# Patient Record
Sex: Female | Born: 1965
Health system: Southern US, Community
[De-identification: ages and names within clinical notes are randomized; demographics above are authoritative.]

## PROBLEM LIST (undated history)

## (undated) DIAGNOSIS — M797 Fibromyalgia: Secondary | ICD-10-CM

## (undated) DIAGNOSIS — B1920 Unspecified viral hepatitis C without hepatic coma: Secondary | ICD-10-CM

## (undated) DIAGNOSIS — F32A Depression, unspecified: Secondary | ICD-10-CM

## (undated) DIAGNOSIS — F4310 Post-traumatic stress disorder, unspecified: Secondary | ICD-10-CM

## (undated) DIAGNOSIS — R413 Other amnesia: Secondary | ICD-10-CM

## (undated) DIAGNOSIS — F319 Bipolar disorder, unspecified: Secondary | ICD-10-CM

## (undated) DIAGNOSIS — F329 Major depressive disorder, single episode, unspecified: Secondary | ICD-10-CM

## (undated) HISTORY — DX: Other amnesia: R41.3

## (undated) HISTORY — DX: Unspecified viral hepatitis C without hepatic coma: B19.20

## (undated) HISTORY — DX: Bipolar disorder, unspecified: F31.9

## (undated) HISTORY — DX: Fibromyalgia: M79.7

## (undated) HISTORY — DX: Post-traumatic stress disorder, unspecified: F43.10

## (undated) HISTORY — DX: Major depressive disorder, single episode, unspecified: F32.9

## (undated) HISTORY — PX: ABDOMINAL HYSTERECTOMY: SHX81

## (undated) HISTORY — PX: OTHER SURGICAL HISTORY: SHX169

## (undated) HISTORY — PX: APPENDECTOMY: SHX54

## (undated) HISTORY — DX: Depression, unspecified: F32.A

---

## 2012-10-03 ENCOUNTER — Encounter (INDEPENDENT_AMBULATORY_CARE_PROVIDER_SITE_OTHER): Payer: Self-pay | Admitting: *Deleted

## 2012-10-10 ENCOUNTER — Ambulatory Visit (INDEPENDENT_AMBULATORY_CARE_PROVIDER_SITE_OTHER): Payer: Self-pay | Admitting: Internal Medicine

## 2012-10-16 ENCOUNTER — Ambulatory Visit (INDEPENDENT_AMBULATORY_CARE_PROVIDER_SITE_OTHER): Payer: Self-pay | Admitting: Internal Medicine

## 2012-10-30 ENCOUNTER — Ambulatory Visit (INDEPENDENT_AMBULATORY_CARE_PROVIDER_SITE_OTHER): Payer: Medicare FFS | Admitting: Internal Medicine

## 2012-10-30 ENCOUNTER — Encounter (INDEPENDENT_AMBULATORY_CARE_PROVIDER_SITE_OTHER): Payer: Self-pay | Admitting: Internal Medicine

## 2012-10-30 VITALS — BP 96/58 | HR 72 | Temp 97.7°F | Ht 66.0 in | Wt 133.7 lb

## 2012-10-30 DIAGNOSIS — B1920 Unspecified viral hepatitis C without hepatic coma: Secondary | ICD-10-CM | POA: Insufficient documentation

## 2012-10-30 DIAGNOSIS — F319 Bipolar disorder, unspecified: Secondary | ICD-10-CM

## 2012-10-30 DIAGNOSIS — F32A Depression, unspecified: Secondary | ICD-10-CM | POA: Insufficient documentation

## 2012-10-30 DIAGNOSIS — IMO0001 Reserved for inherently not codable concepts without codable children: Secondary | ICD-10-CM

## 2012-10-30 DIAGNOSIS — F329 Major depressive disorder, single episode, unspecified: Secondary | ICD-10-CM

## 2012-10-30 DIAGNOSIS — A6000 Herpesviral infection of urogenital system, unspecified: Secondary | ICD-10-CM

## 2012-10-30 DIAGNOSIS — F4310 Post-traumatic stress disorder, unspecified: Secondary | ICD-10-CM

## 2012-10-30 DIAGNOSIS — M797 Fibromyalgia: Secondary | ICD-10-CM

## 2012-10-30 NOTE — Patient Instructions (Addendum)
Labs for Hep C.  OV in 1 month.
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
## 2012-10-31 LAB — COMPREHENSIVE METABOLIC PANEL
ALT: 34 U/L (ref 0–35)
AST: 38 U/L — ABNORMAL HIGH (ref 0–37)
Albumin: 3.6 g/dL (ref 3.5–5.2)
Alkaline Phosphatase: 81 U/L (ref 39–117)
Potassium: 3.9 mEq/L (ref 3.5–5.3)
Sodium: 140 mEq/L (ref 135–145)
Total Protein: 6.2 g/dL (ref 6.0–8.3)

## 2012-10-31 LAB — CBC WITH DIFFERENTIAL/PLATELET
Basophils Absolute: 0 10*3/uL (ref 0.0–0.1)
Basophils Relative: 0 % (ref 0–1)
Hemoglobin: 11.2 g/dL — ABNORMAL LOW (ref 12.0–15.0)
MCHC: 33.4 g/dL (ref 30.0–36.0)
Monocytes Relative: 8 % (ref 3–12)
Neutro Abs: 3.9 10*3/uL (ref 1.7–7.7)
Neutrophils Relative %: 55 % (ref 43–77)
RDW: 12.8 % (ref 11.5–15.5)

## 2012-10-31 LAB — PROTIME-INR
INR: 0.95 (ref <1.50)
Prothrombin Time: 12.7 seconds (ref 11.6–15.2)

## 2012-10-31 LAB — AFP TUMOR MARKER: AFP-Tumor Marker: 1.6 ng/mL (ref 0.0–8.0)

## 2012-11-01 ENCOUNTER — Ambulatory Visit (HOSPITAL_COMMUNITY): Payer: Medicare FFS

## 2012-11-05 ENCOUNTER — Encounter (INDEPENDENT_AMBULATORY_CARE_PROVIDER_SITE_OTHER): Payer: Self-pay

## 2012-11-08 ENCOUNTER — Ambulatory Visit (HOSPITAL_COMMUNITY)
Admission: RE | Admit: 2012-11-08 | Discharge: 2012-11-08 | Disposition: A | Discharge status: Home / Self Care | Payer: Medicare FFS | Source: Ambulatory Visit | Attending: Internal Medicine | Admitting: Internal Medicine

## 2012-11-08 DIAGNOSIS — B1920 Unspecified viral hepatitis C without hepatic coma: Secondary | ICD-10-CM

## 2012-11-15 ENCOUNTER — Telehealth (INDEPENDENT_AMBULATORY_CARE_PROVIDER_SITE_OTHER): Payer: Self-pay | Admitting: Internal Medicine

## 2012-11-15 NOTE — Telephone Encounter (Signed)
No answer at home #

## 2012-11-16 ENCOUNTER — Other Ambulatory Visit (INDEPENDENT_AMBULATORY_CARE_PROVIDER_SITE_OTHER): Payer: Self-pay | Admitting: Internal Medicine

## 2012-11-23 LAB — HEPATITIS C GENOTYPE: HCV Genotype: 3

## 2012-12-03 ENCOUNTER — Ambulatory Visit (INDEPENDENT_AMBULATORY_CARE_PROVIDER_SITE_OTHER): Payer: Medicare FFS | Admitting: Internal Medicine

## 2012-12-25 ENCOUNTER — Encounter (INDEPENDENT_AMBULATORY_CARE_PROVIDER_SITE_OTHER): Payer: Self-pay | Admitting: Internal Medicine

## 2012-12-25 ENCOUNTER — Ambulatory Visit (INDEPENDENT_AMBULATORY_CARE_PROVIDER_SITE_OTHER): Payer: Medicare FFS | Admitting: Internal Medicine

## 2012-12-25 VITALS — BP 118/70 | HR 76 | Temp 97.4°F | Resp 18 | Ht 66.0 in | Wt 142.3 lb

## 2012-12-25 DIAGNOSIS — B182 Chronic viral hepatitis C: Secondary | ICD-10-CM
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
## 2012-12-25 NOTE — Patient Instructions (Addendum)
Ultrasound-guided liver biopsy to be arranged

## 2012-12-27 ENCOUNTER — Other Ambulatory Visit: Payer: Self-pay | Admitting: Radiology

## 2012-12-31 ENCOUNTER — Ambulatory Visit (HOSPITAL_COMMUNITY)
Admission: RE | Admit: 2012-12-31 | Discharge: 2012-12-31 | Disposition: A | Discharge status: Home / Self Care | Payer: Medicare FFS | Source: Ambulatory Visit | Attending: Internal Medicine | Admitting: Internal Medicine

## 2012-12-31 ENCOUNTER — Encounter (HOSPITAL_COMMUNITY): Payer: Self-pay

## 2012-12-31 DIAGNOSIS — Z803 Family history of malignant neoplasm of breast: Secondary | ICD-10-CM | POA: Insufficient documentation

## 2012-12-31 DIAGNOSIS — F4310 Post-traumatic stress disorder, unspecified: Secondary | ICD-10-CM | POA: Insufficient documentation

## 2012-12-31 DIAGNOSIS — B182 Chronic viral hepatitis C: Secondary | ICD-10-CM | POA: Insufficient documentation

## 2012-12-31 DIAGNOSIS — Z9010 Acquired absence of unspecified breast and nipple: Secondary | ICD-10-CM | POA: Insufficient documentation

## 2012-12-31 DIAGNOSIS — IMO0001 Reserved for inherently not codable concepts without codable children: Secondary | ICD-10-CM | POA: Insufficient documentation

## 2012-12-31 DIAGNOSIS — K7460 Unspecified cirrhosis of liver: Secondary | ICD-10-CM | POA: Insufficient documentation

## 2012-12-31 LAB — CBC
Hemoglobin: 12.6 g/dL (ref 12.0–15.0)
MCHC: 33.4 g/dL (ref 30.0–36.0)
RBC: 4.25 MIL/uL (ref 3.87–5.11)
WBC: 7.2 10*3/uL (ref 4.0–10.5)

## 2012-12-31 LAB — PROTIME-INR
INR: 0.96 (ref 0.00–1.49)
Prothrombin Time: 12.7 seconds (ref 11.6–15.2)

## 2012-12-31 LAB — APTT: aPTT: 29 seconds (ref 24–37)

## 2012-12-31 MED ORDER — FENTANYL CITRATE 0.05 MG/ML IJ SOLN
INTRAMUSCULAR | Status: AC
  Filled 2012-12-31: qty 4

## 2012-12-31 MED ORDER — SODIUM CHLORIDE 0.9 % IV SOLN
INTRAVENOUS | Status: DC
  Administered 2012-12-31: 08:00:00 via INTRAVENOUS

## 2012-12-31 MED ORDER — MIDAZOLAM HCL 2 MG/2ML IJ SOLN
INTRAMUSCULAR | Status: AC
  Filled 2012-12-31: qty 4

## 2012-12-31 MED ORDER — MIDAZOLAM HCL 2 MG/2ML IJ SOLN
INTRAMUSCULAR | Status: AC | PRN
  Administered 2012-12-31 (×3): 1 mg via INTRAVENOUS

## 2012-12-31 MED ORDER — FENTANYL CITRATE 0.05 MG/ML IJ SOLN
INTRAMUSCULAR | Status: AC | PRN
  Administered 2012-12-31 (×3): 50 ug via INTRAVENOUS

## 2012-12-31 MED ORDER — HYDROCODONE-ACETAMINOPHEN 5-325 MG PO TABS
1.0000 | ORAL_TABLET | ORAL | Status: DC | PRN
  Administered 2012-12-31: 1 via ORAL

## 2012-12-31 MED ORDER — HYDROCODONE-ACETAMINOPHEN 5-325 MG PO TABS
ORAL_TABLET | ORAL | Status: AC
  Administered 2012-12-31: 1 via ORAL
  Filled 2012-12-31: qty 1

## 2012-12-31 MED ORDER — MECLIZINE HCL 25 MG PO TABS
25.0000 mg | ORAL_TABLET | Freq: Once | ORAL | Status: DC
  Filled 2012-12-31 (×2): qty 1

## 2012-12-31 NOTE — H&P (Signed)
Shannon Chandler is an 47 y.o. female.   Chief Complaint: Hep C dx 3 yrs ago Scheduled now for Liver Core Bx per Dr Karilyn Cota HPI: PTSD; Bipolar; fibromyalgia  Past Medical History  Diagnosis Date  . PTSD (post-traumatic stress disorder)   . Depression   . Bipolar 1 disorder   . Fibromyalgia   . Memory loss     Past Surgical History  Procedure Laterality Date  . Appendectomy    . Tonsillecdtomy    . Double mastectomy for famiy hx      4 yrs ago    History reviewed. No pertinent family history. Social History:  reports that she has never smoked. She has never used smokeless tobacco. She reports that  drinks alcohol. She reports that she does not use illicit drugs.  Allergies:  Allergies  Allergen Reactions  . Sulfa Antibiotics   . Zoloft (Sertraline Hcl)      (Not in a hospital admission)  Results for orders placed during the hospital encounter of 12/31/12 (from the past 48 hour(s))  APTT     Status: None   Collection Time    12/31/12  8:18 AM      Result Value Range   aPTT 29  24 - 37 seconds  CBC     Status: None   Collection Time    12/31/12  8:18 AM      Result Value Range   WBC 7.2  4.0 - 10.5 K/uL   RBC 4.25  3.87 - 5.11 MIL/uL   Hemoglobin 12.6  12.0 - 15.0 g/dL   HCT 96.0  45.4 - 09.8 %   MCV 88.7  78.0 - 100.0 fL   MCH 29.6  26.0 - 34.0 pg   MCHC 33.4  30.0 - 36.0 g/dL   RDW 11.9  14.7 - 82.9 %   Platelets 228  150 - 400 K/uL  PROTIME-INR     Status: None   Collection Time    12/31/12  8:18 AM      Result Value Range   Prothrombin Time 12.7  11.6 - 15.2 seconds   INR 0.96  0.00 - 1.49   No results found.  Review of Systems  Constitutional: Negative for fever and chills.  Respiratory: Negative for cough.   Cardiovascular: Negative for chest pain.  Gastrointestinal: Negative for nausea and vomiting.  Neurological: Negative for dizziness and headaches.    Pulse 75, temperature 97.4 F (36.3 C), temperature source Oral, resp. rate 18, height 5'  6" (1.676 m), weight 146 lb (66.225 kg), last menstrual period 12/07/2012, SpO2 96.00%. Physical Exam  Constitutional: She is oriented to person, place, and time. She appears well-developed and well-nourished.  Cardiovascular: Normal rate, regular rhythm and normal heart sounds.   No murmur heard. Respiratory: Effort normal and breath sounds normal.  GI: Soft. Bowel sounds are normal. There is no tenderness.  Musculoskeletal: Normal range of motion.  Neurological: She is alert and oriented to person, place, and time.  Skin: Skin is warm and dry.  Psychiatric: She has a normal mood and affect. Her behavior is normal. Judgment and thought content normal.     Assessment/Plan Hep C Scheduled for liver core bx Pt aware of procedure benefits and risks and agreeable to proceed Consent signed and in chart  Kiwan Gadsden A 12/31/2012, 9:17 AM

## 2012-12-31 NOTE — Procedures (Signed)
[place] core liver biopsy 18g x3 No complication No blood loss. See complete dictation in [facility].

## 2013-01-08 NOTE — Progress Notes (Signed)
No answer or answering machine.

## 2013-01-10 NOTE — Progress Notes (Signed)
Apt has been scheduled for 01/28/13 with Dr. Rehman. 

## 2013-01-28 ENCOUNTER — Ambulatory Visit (INDEPENDENT_AMBULATORY_CARE_PROVIDER_SITE_OTHER): Payer: Medicare FFS | Admitting: Internal Medicine

## 2013-01-29 ENCOUNTER — Ambulatory Visit (INDEPENDENT_AMBULATORY_CARE_PROVIDER_SITE_OTHER): Payer: Medicare FFS | Admitting: Internal Medicine

## 2013-01-29 ENCOUNTER — Encounter (INDEPENDENT_AMBULATORY_CARE_PROVIDER_SITE_OTHER): Payer: Self-pay | Admitting: Internal Medicine

## 2013-01-29 VITALS — BP 100/60 | HR 88 | Ht 66.0 in | Wt 140.7 lb

## 2013-01-29 DIAGNOSIS — B1920 Unspecified viral hepatitis C without hepatic coma: Secondary | ICD-10-CM

## 2013-01-29 NOTE — Progress Notes (Signed)
Subjective:     Patient ID: Shannon Chandler, female   DOB: 1966/01/28, 47 y.o.   MRN: [id number] Ask about thyroid HPI Here today for f/u of her Hep C. Hx of depression, Bipolar. She sees psychiatrist Dr. Dorise Bullion Dasarl at the [facility] for depression. She says her depression is controlled with Prozac. Last saw Dr. Einar Grad last month.  Two tattoos and hx of IV drug use 5 yrs ago.  She tells me her thyroid was normal. States she has finished the Hep B vaccine. Appetite is good. No weight loss. No abdominal pain.  BMs are normal.     Genotype 3. 12/31/2012 Liver biopsy shows termination grade 1-2 and fibrosis 0-1 Results reviewed with patient. Will initiate antiviral therapy following office visit. Please make office visit for first week in March,2014. Biopsy report to PCP   HCV RNA by PCR [id number] international units/mL on 10/30/2012.  genotype 3.  Hepatobiliary ultrasound from 11/08/2012 reveals no abnormality to liver, spleen, bile ducts or gallbladder.       CMP     Component Value Date/Time   NA 140 10/30/2012 1535   K 3.9 10/30/2012 1535   CL 104 10/30/2012 1535   CO2 26 10/30/2012 1535   GLUCOSE 80 10/30/2012 1535   BUN 14 10/30/2012 1535   CREATININE 0.55 10/30/2012 1535   CALCIUM 8.4 10/30/2012 1535   PROT 6.2 10/30/2012 1535   ALBUMIN 3.6 10/30/2012 1535   AST 38* 10/30/2012 1535   ALT 34 10/30/2012 1535   ALKPHOS 81 10/30/2012 1535   BILITOT 0.4 10/30/2012 1535         Review of Systems see hpi Current Outpatient Prescriptions  Medication Sig Dispense Refill  . acyclovir (ZOVIRAX) 800 MG tablet Take 800 mg by mouth 2 (two) times daily as needed.       . budesonide-formoterol (SYMBICORT) 160-4.5 MCG/ACT inhaler Inhale 2 puffs into the lungs 2 (two) times daily.      . busPIRone (BUSPAR) 10 MG tablet Take 10 mg by mouth 3 (three) times daily.      . cyclobenzaprine (FLEXERIL) 10 MG tablet Take 10 mg by mouth 3 (three) times daily as needed.      Marland Kitchen FLUoxetine  (PROZAC) 20 MG tablet Take 40 mg by mouth daily.       Marland Kitchen FOLIC ACID PO Take by mouth daily.      Marland Kitchen gabapentin (NEURONTIN) 300 MG capsule Take 300 mg by mouth at bedtime.      . pantoprazole (PROTONIX) 40 MG tablet Take 40 mg by mouth daily.      . prazosin (MINIPRESS) 2 MG capsule Take 2 mg by mouth at bedtime.      . Prenatal Vit-Fe Sulfate-FA (PRENATAL VITAMIN PO) Take by mouth daily.      . traMADol (ULTRAM) 50 MG tablet Take 50 mg by mouth every 6 (six) hours as needed.       No current facility-administered medications for this visit.   Past Medical History  Diagnosis Date  . PTSD (post-traumatic stress disorder)   . Depression   . Bipolar 1 disorder   . Fibromyalgia   . Memory loss   . Hepatitis C    Past Surgical History  Procedure Laterality Date  . Appendectomy    . Tonsillecdtomy    . Double mastectomy for famiy hx      4 yrs ago        Objective:   Physical Exam  Filed Vitals:   01/29/13  1428  BP: 100/60  Pulse: 88  Height: 5\' 6"  (1.676 m)  Weight: 140 lb 11.2 oz (63.821 kg)  Alert and oriented. Skin warm and dry. Oral mucosa is moist.   . Sclera anicteric, conjunctivae is pink. Thyroid not enlarged. No cervical lymphadenopathy. Lungs clear. Heart regular rate and rhythm.  Abdomen is soft. Bowel sounds are positive. No hepatomegaly. No abdominal masses felt. No tenderness.  No edema to lower extremities. Patient is alert and oriented.      Assessment:    Hepatitis C. Dr. Karilyn Cota in with patient.     Plan:    CBC om 2 weels/ Will; treat with Sovaldi and Ribavirin per Dr. Karilyn Cota

## 2013-01-29 NOTE — Patient Instructions (Signed)
Next visit pending approval of her Hep C drugs

## 2013-03-02 ENCOUNTER — Encounter (HOSPITAL_COMMUNITY): Payer: Self-pay | Admitting: *Deleted

## 2013-03-02 ENCOUNTER — Emergency Department (HOSPITAL_COMMUNITY)
Admission: EM | Admit: 2013-03-02 | Discharge: 2013-03-02 | Disposition: A | Discharge status: Home / Self Care | Payer: Medicare FFS | Attending: Emergency Medicine | Admitting: Emergency Medicine

## 2013-03-02 DIAGNOSIS — Z8669 Personal history of other diseases of the nervous system and sense organs: Secondary | ICD-10-CM | POA: Insufficient documentation

## 2013-03-02 DIAGNOSIS — F4310 Post-traumatic stress disorder, unspecified: Secondary | ICD-10-CM | POA: Insufficient documentation

## 2013-03-02 DIAGNOSIS — Z79899 Other long term (current) drug therapy: Secondary | ICD-10-CM | POA: Insufficient documentation

## 2013-03-02 DIAGNOSIS — L02419 Cutaneous abscess of limb, unspecified: Secondary | ICD-10-CM | POA: Insufficient documentation

## 2013-03-02 DIAGNOSIS — IMO0002 Reserved for concepts with insufficient information to code with codable children: Secondary | ICD-10-CM | POA: Insufficient documentation

## 2013-03-02 DIAGNOSIS — Z8739 Personal history of other diseases of the musculoskeletal system and connective tissue: Secondary | ICD-10-CM | POA: Insufficient documentation

## 2013-03-02 DIAGNOSIS — L0390 Cellulitis, unspecified: Secondary | ICD-10-CM

## 2013-03-02 DIAGNOSIS — Z8619 Personal history of other infectious and parasitic diseases: Secondary | ICD-10-CM | POA: Insufficient documentation

## 2013-03-02 DIAGNOSIS — F319 Bipolar disorder, unspecified: Secondary | ICD-10-CM | POA: Insufficient documentation

## 2013-03-02 DIAGNOSIS — L03319 Cellulitis of trunk, unspecified: Secondary | ICD-10-CM | POA: Insufficient documentation

## 2013-03-02 DIAGNOSIS — L02219 Cutaneous abscess of trunk, unspecified: Secondary | ICD-10-CM | POA: Insufficient documentation

## 2013-03-02 MED ORDER — MUPIROCIN CALCIUM 2 % NA OINT: TOPICAL_OINTMENT | NASAL | Status: DC

## 2013-03-02 MED ORDER — DOXYCYCLINE HYCLATE 100 MG PO CAPS: 100.0000 mg | ORAL_CAPSULE | Freq: Two times a day (BID) | ORAL | Status: DC

## 2013-03-02 NOTE — ED Provider Notes (Signed)
History     CSN: [id number]  Arrival date & time 03/02/13  2150   First MD Initiated Contact with Patient 03/02/13 2241      Chief Complaint  Patient presents with  . Abscess   HPI  History provided by the patient. Patient is a 47 year old female with history of hepatitis C, bipolar disorder and fibromyalgia who presents with concerns for redness and swelling of the skin. Patient reports having a small nodular tender area to the left lower abdomen and hip. Symptoms first presented 2 weeks ago have been waxing and waning. Patient states there was some purulent yellow-green fluid with blood draining from the area. She again tried to push on the area earlier today with small amount of bleeding. Complains of continued soreness and redness to the skin this area. Patient also has second complaint of waxing waning swelling to the inner right nostril with occasional bleeding and drainage. Currently symptoms are improved but they recur frequently. Patient was recently treated with the other pruritic rash of the skin by PCP with topical steroid. Symptoms have since improved. Denies any other lesions or sores on the body. Denies any fever, chills or sweats.    Past Medical History  Diagnosis Date  . PTSD (post-traumatic stress disorder)   . Depression   . Bipolar 1 disorder   . Fibromyalgia   . Memory loss   . Hepatitis C     Past Surgical History  Procedure Laterality Date  . Appendectomy    . Tonsillecdtomy    . Double mastectomy for famiy hx      4 yrs ago    History reviewed. No pertinent family history.  History  Substance Use Topics  . Smoking status: Never Smoker   . Smokeless tobacco: Never Used  . Alcohol Use: Yes     Comment: Occasionally etoh, every couple of month    OB History   Grav Para Term Preterm Abortions TAB SAB Ect Mult Living                  Review of Systems  Constitutional: Negative for fever, chills, diaphoresis and appetite change.  All other  systems reviewed and are negative.    Allergies  Sulfa antibiotics and Zoloft  Home Medications   Current Outpatient Rx  Name  Route  Sig  Dispense  Refill  . acyclovir (ZOVIRAX) 800 MG tablet   Oral   Take 800 mg by mouth 2 (two) times daily.          . budesonide-formoterol (SYMBICORT) 160-4.5 MCG/ACT inhaler   Inhalation   Inhale 2 puffs into the lungs 2 (two) times daily.         . busPIRone (BUSPAR) 10 MG tablet   Oral   Take 10 mg by mouth 3 (three) times daily.         . cyclobenzaprine (FLEXERIL) 10 MG tablet   Oral   Take 10 mg by mouth 3 (three) times daily as needed for muscle spasms.          Marland Kitchen FLUoxetine (PROZAC) 20 MG tablet   Oral   Take 40 mg by mouth daily.          Marland Kitchen FOLIC ACID PO   Oral   Take 1 tablet by mouth daily.          Marland Kitchen gabapentin (NEURONTIN) 300 MG capsule   Oral   Take 300 mg by mouth at bedtime.         Marland Kitchen  pantoprazole (PROTONIX) 40 MG tablet   Oral   Take 40 mg by mouth daily.         . prazosin (MINIPRESS) 2 MG capsule   Oral   Take 2 mg by mouth at bedtime.         . Prenatal Vit-Fe Sulfate-FA (PRENATAL VITAMIN PO)   Oral   Take 1 tablet by mouth daily.          . traMADol (ULTRAM) 50 MG tablet   Oral   Take 50 mg by mouth every 6 (six) hours as needed for pain.            BP 120/75  Pulse 93  Temp(Src) 98.1 F (36.7 C) (Oral)  Resp 16  SpO2 99%  LMP 02/11/2013  Physical Exam  Nursing note and vitals reviewed. Constitutional: She is oriented to person, place, and time. She appears well-developed and well-nourished. No distress.  HENT:  Head: Normocephalic.  Mouth/Throat: Oropharynx is clear and moist.  Small nodule to the right inner nostril without bleeding or drainage. No significant erythema.  Cardiovascular: Normal rate and regular rhythm.   Pulmonary/Chest: Effort normal and breath sounds normal. No respiratory distress.  Abdominal: Soft. There is no tenderness.  Musculoskeletal:  Normal range of motion.  Neurological: She is alert and oriented to person, place, and time.  Skin: Skin is warm and dry. No rash noted.  4 cm area of erythema without significant induration to the left lower waist line/pubic area. There is a small central ulceration without significant bleeding or drainage at this time.  Psychiatric: She has a normal mood and affect. Her behavior is normal.    ED Course  Procedures     1. Cellulitis       MDM  10:40PM patient seen and evaluated. Patient well-appearing in no acute distress. Does not appear severely ill or Toxic.  No signs of drainable abscess at this time. There is surrounding erythema will cover for cellulitis. Bactroban ointment for nasal infection.          Angus Seller, PA-C 03/02/13 2328

## 2013-03-02 NOTE — ED Notes (Signed)
Pt c/o left groin abscess x 2 weeks.  States it is draining green fluid.

## 2013-03-03 NOTE — ED Provider Notes (Signed)
Medical screening examination/treatment/procedure(s) were performed by non-physician practitioner and as supervising physician I was immediately available for consultation/collaboration.   Glynn Octave, MD 03/03/13 [phone]

## 2013-03-12 ENCOUNTER — Telehealth (INDEPENDENT_AMBULATORY_CARE_PROVIDER_SITE_OTHER): Payer: Self-pay | Admitting: *Deleted

## 2013-03-12 NOTE — Telephone Encounter (Signed)
Called to update address and phone number. She would also like to speak with Tammy about her medicines if she would please return the call to [phone].

## 2013-03-20 ENCOUNTER — Encounter (INDEPENDENT_AMBULATORY_CARE_PROVIDER_SITE_OTHER): Payer: Self-pay | Admitting: *Deleted

## 2013-03-20 NOTE — Telephone Encounter (Signed)
Patient called at the number provided below. No voice mail has been set up at this time. A letter will be sent to the address she recently provided. Also ,note that [employer] has been trying to reach the patient to go over her Hepatitis C medications. Our office has updated them with the recent information that she has provided to [place].

## 2013-03-26 ENCOUNTER — Telehealth (INDEPENDENT_AMBULATORY_CARE_PROVIDER_SITE_OTHER): Payer: Self-pay | Admitting: *Deleted

## 2013-03-26 NOTE — Telephone Encounter (Signed)
Shannon Chandler stating she has received her 1st dose of medication but has not started it. Shannon Chandler thinks she is pregnant and is waiting confirmation on this. If so, treatment will have to wait and if not she will start. Her return phone number is [phone] or [phone].

## 2013-03-27 NOTE — Telephone Encounter (Signed)
Treatment on hold

## 2013-03-27 NOTE — Telephone Encounter (Signed)
Forwarded to Delrae Rend for review. We will wait for the patient to contact us with the outcome of pregnancy test,or confirmation of her being pregnant. She has informed us [employer] , when they reached her, that she was pregnant.

## 2013-05-27 ENCOUNTER — Telehealth (INDEPENDENT_AMBULATORY_CARE_PROVIDER_SITE_OTHER): Payer: Self-pay | Admitting: *Deleted

## 2013-05-27 NOTE — Telephone Encounter (Signed)
We rec'd a copy of the patient's  Approval coverage for Sovaldi. This authorization is good until 08/23/13. Note that the patient called our office on 04/05/13 and stated that she thought that she was pregnant, and per Dorene Ar, the treatment is on hold until further notice. Patient was going to let us know when she was going to be ready for the treatment.

## 2013-07-18 ENCOUNTER — Telehealth (INDEPENDENT_AMBULATORY_CARE_PROVIDER_SITE_OTHER): Payer: Self-pay | Admitting: *Deleted

## 2013-07-18 DIAGNOSIS — B1920 Unspecified viral hepatitis C without hepatic coma: Secondary | ICD-10-CM

## 2013-07-18 NOTE — Telephone Encounter (Signed)
LM for patient to return the call.  

## 2013-07-18 NOTE — Telephone Encounter (Signed)
Shannon Chandler presented to our office on August 20,2014 to say that she had started her Hepatitis C Medication 2 weeks prior , this being August 6 , 2014. This patient had contacted our offcie via phone several weeks ago to say that she was pregnant and would be unable to start the medications. The insurance [employer] was made aware of this and apparently they shipped to her the Ribavirin, Solvaldi. Dorene Ar, NP was made aware. The patient is past her 1 st required lab , so we will go ahead and order the 4 week labs and make her an appointment.

## 2013-07-18 NOTE — Telephone Encounter (Signed)
I Have called the patient at this number [phone] and left her a message asking that she go get labs as soon as possible and that Shannon Chandler will be calling her with an appointment to be seen in the office.

## 2013-07-19 NOTE — Telephone Encounter (Signed)
Apt has been scheduled for 09/004/14 at 11:15 am with Dorene Ar, NP.

## 2013-07-24 LAB — HEPATIC FUNCTION PANEL
ALT: 9 U/L (ref 0–35)
Alkaline Phosphatase: 80 U/L (ref 39–117)
Bilirubin, Direct: 0.1 mg/dL (ref 0.0–0.3)
Indirect Bilirubin: 0.4 mg/dL (ref 0.0–0.9)

## 2013-07-24 LAB — CBC WITH DIFFERENTIAL/PLATELET
Basophils Absolute: 0 10*3/uL (ref 0.0–0.1)
Eosinophils Relative: 2 % (ref 0–5)
HCT: 35 % — ABNORMAL LOW (ref 36.0–46.0)
Hemoglobin: 11.4 g/dL — ABNORMAL LOW (ref 12.0–15.0)
Lymphocytes Relative: 33 % (ref 12–46)
Lymphs Abs: 2.3 10*3/uL (ref 0.7–4.0)
MCV: 86.6 fL (ref 78.0–100.0)
Monocytes Absolute: 0.4 10*3/uL (ref 0.1–1.0)
Monocytes Relative: 5 % (ref 3–12)
Neutro Abs: 4.3 10*3/uL (ref 1.7–7.7)
RBC: 4.04 MIL/uL (ref 3.87–5.11)
RDW: 16.2 % — ABNORMAL HIGH (ref 11.5–15.5)
WBC: 7.1 10*3/uL (ref 4.0–10.5)

## 2013-07-25 ENCOUNTER — Ambulatory Visit (INDEPENDENT_AMBULATORY_CARE_PROVIDER_SITE_OTHER): Payer: Medicare FFS | Admitting: Internal Medicine

## 2013-07-25 LAB — HEPATITIS C RNA QUANTITATIVE

## 2013-07-26 ENCOUNTER — Telehealth (INDEPENDENT_AMBULATORY_CARE_PROVIDER_SITE_OTHER): Payer: Self-pay | Admitting: *Deleted

## 2013-07-26 ENCOUNTER — Ambulatory Visit (INDEPENDENT_AMBULATORY_CARE_PROVIDER_SITE_OTHER): Payer: Medicare FFS | Admitting: Internal Medicine

## 2013-07-26 ENCOUNTER — Encounter (INDEPENDENT_AMBULATORY_CARE_PROVIDER_SITE_OTHER): Payer: Self-pay | Admitting: Internal Medicine

## 2013-07-26 VITALS — BP 108/54 | HR 88 | Temp 98.5°F | Ht 66.0 in | Wt 154.0 lb

## 2013-07-26 DIAGNOSIS — B1710 Acute hepatitis C without hepatic coma: Secondary | ICD-10-CM

## 2013-07-26 DIAGNOSIS — B1920 Unspecified viral hepatitis C without hepatic coma: Secondary | ICD-10-CM

## 2013-07-26 DIAGNOSIS — N926 Irregular menstruation, unspecified: Secondary | ICD-10-CM

## 2013-07-26 NOTE — Patient Instructions (Addendum)
Solvaldi 400mg  daily. Ribavarin 200mg : two in am and 3 at night. OV 4 weeks with a CBC, and a CMET.

## 2013-07-26 NOTE — Telephone Encounter (Signed)
.  Per Terri Setzer,NP patient to have labs in 4 weeks. 

## 2013-07-26 NOTE — Progress Notes (Signed)
Subjective:     Patient ID: Shannon Chandler, female   DOB: [birth year], 47 y.o.   MRN: [id number]  HPI Patient is here for r/u of her Hepatitis C treatment. Her weight today is 154.0.  She is Genotype 3.   She started treatment on June 20, 2013. She has been taking Solvaldi 400mg  daily and 1200mg  of Ribavirin daily. This is the wrong dose. She actually started the medication without our knowledge. Treatment had been on hold because she thought she was pregnancy.  She missed 2 1/2 months of her period. She saw her Gyn physician in [place] and her pregnancy was negative. She underwent an [place] of her uterus and was normal. She was placed on Norethindrone 5mg . Her last period was 07/07/2013. She is not sexually active with a man she tells me. Her partner is present in the room today.  She tells me she feels good. She has some fatigue, headache.  Her appetite is good. She has gained 14 pounds since March of this year. Her BMs are normal. No melena or bright red rectal bleeding. She says she had a low grade fever about 3 weeks ago.     12/31/2012 Liver biopsy shows termination grade 1-2 and fibrosis 0-1        6 13/2014 TSH 4.38 HCG Quant  Pregnancy is negative.   CBC    Component Value Date/Time   WBC 7.1 07/24/2013 1020   RBC 4.04 07/24/2013 1020   HGB 11.4* 07/24/2013 1020   HCT 35.0* 07/24/2013 1020   PLT 362 07/24/2013 1020   MCV 86.6 07/24/2013 1020   MCH 28.2 07/24/2013 1020   MCHC 32.6 07/24/2013 1020   RDW 16.2* 07/24/2013 1020   LYMPHSABS 2.3 07/24/2013 1020   MONOABS 0.4 07/24/2013 1020   EOSABS 0.2 07/24/2013 1020   BASOSABS 0.0 07/24/2013 1020     Review of Systems  See hpi Current Outpatient Prescriptions  Medication Sig Dispense Refill  . acyclovir (ZOVIRAX) 800 MG tablet Take 800 mg by mouth as needed.       . budesonide-formoterol (SYMBICORT) 160-4.5 MCG/ACT inhaler Inhale 2 puffs into the lungs 2 (two) times daily.      . busPIRone (BUSPAR) 10 MG tablet Take 10 mg by mouth 2 (two) times  daily before a meal.       . cyclobenzaprine (FLEXERIL) 10 MG tablet Take 10 mg by mouth 3 (three) times daily as needed for muscle spasms.       Marland Kitchen FLUoxetine (PROZAC) 20 MG tablet Take 40 mg by mouth daily.       . Fluticasone-Salmeterol (ADVAIR) 250-50 MCG/DOSE AEPB Inhale 1 puff into the lungs every 12 (twelve) hours.      . gabapentin (NEURONTIN) 300 MG capsule Take 300 mg by mouth at bedtime.      Marland Kitchen loratadine (CLARITIN) 10 MG tablet Take 10 mg by mouth daily.      . mometasone (ASMANEX 120 METERED DOSES) 220 MCG/INH inhaler Inhale 2 puffs into the lungs daily.      . mupirocin nasal ointment (BACTROBAN) 2 % Apply in each nostril daily  1 g  0  . norethindrone (AYGESTIN) 5 MG tablet Take 5 mg by mouth daily.      . pantoprazole (PROTONIX) 40 MG tablet Take 40 mg by mouth daily.      . prazosin (MINIPRESS) 2 MG capsule Take 2 mg by mouth at bedtime.      . Prenatal Vit-Fe Sulfate-FA (PRENATAL VITAMIN PO) Take 1  tablet by mouth daily.       Marland Kitchen RIBAVIRIN PO Take 200 mg by mouth. Two in am and 3 in pm      . Sofosbuvir (SOVALDI) 400 MG TABS Take by mouth.      . traMADol (ULTRAM) 50 MG tablet Take 50 mg by mouth every 6 (six) hours as needed for pain.       . ziprasidone (GEODON) 60 MG capsule Take 60 mg by mouth at bedtime.       No current facility-administered medications for this visit.   Past Medical History  Diagnosis Date  . PTSD (post-traumatic stress disorder)   . Depression   . Bipolar 1 disorder   . Fibromyalgia   . Memory loss   . Hepatitis C    Past Surgical History  Procedure Laterality Date  . Appendectomy    . Tonsillecdtomy    . Double mastectomy for famiy hx      4 yrs ago   Allergies  Allergen Reactions  . Sulfa Antibiotics     Unknown  . Zoloft [Sertraline Hcl]     Unknown        Objective:   Physical Exam  Filed Vitals:   07/26/13 0922  BP: 108/54  Pulse: 88  Temp: 98.5 F (36.9 C)  Height: 5\' 6"  (1.676 m)  Weight: 154 lb (69.854 kg)    Alert and oriented. Skin warm and dry. Oral mucosa is moist.   . Sclera anicteric, conjunctivae is pink. Thyroid not enlarged. No cervical lymphadenopathy. Lungs clear. Heart regular rate and rhythm.  Abdomen is soft. Bowel sounds are positive. No hepatomegaly. No abdominal masses felt. No tenderness.  No edema to lower extremities.       Assessment:    Hep C. She has cleared the virus at this point.  She seems to be doing well. She has maintained her her H and H.  No adverse effects.    Plan:     Urine pregnancy today. OV in 4 weeks with a CBC and Cmet.

## 2013-08-21 ENCOUNTER — Encounter (INDEPENDENT_AMBULATORY_CARE_PROVIDER_SITE_OTHER): Payer: Self-pay | Admitting: *Deleted

## 2013-08-21 ENCOUNTER — Other Ambulatory Visit (INDEPENDENT_AMBULATORY_CARE_PROVIDER_SITE_OTHER): Payer: Self-pay | Admitting: *Deleted

## 2013-08-21 DIAGNOSIS — B1920 Unspecified viral hepatitis C without hepatic coma: Secondary | ICD-10-CM

## 2013-08-23 LAB — CBC
HCT: 37.5 % (ref 36.0–46.0)
Hemoglobin: 12.3 g/dL (ref 12.0–15.0)
MCHC: 32.8 g/dL (ref 30.0–36.0)
MCV: 89.3 fL (ref 78.0–100.0)

## 2013-08-23 LAB — COMPREHENSIVE METABOLIC PANEL
Albumin: 4.2 g/dL (ref 3.5–5.2)
Alkaline Phosphatase: 84 U/L (ref 39–117)
BUN: 9 mg/dL (ref 6–23)
Calcium: 9.6 mg/dL (ref 8.4–10.5)
Chloride: 106 mEq/L (ref 96–112)
Creat: 0.89 mg/dL (ref 0.50–1.10)
Glucose, Bld: 73 mg/dL (ref 70–99)
Potassium: 4 mEq/L (ref 3.5–5.3)

## 2013-08-26 ENCOUNTER — Encounter (INDEPENDENT_AMBULATORY_CARE_PROVIDER_SITE_OTHER): Payer: Self-pay | Admitting: Internal Medicine

## 2013-08-26 ENCOUNTER — Ambulatory Visit (INDEPENDENT_AMBULATORY_CARE_PROVIDER_SITE_OTHER): Payer: Medicare FFS | Admitting: Internal Medicine

## 2013-08-26 VITALS — BP 94/62 | HR 80 | Temp 98.3°F | Ht 66.0 in | Wt 156.2 lb

## 2013-08-26 DIAGNOSIS — B1920 Unspecified viral hepatitis C without hepatic coma: Secondary | ICD-10-CM

## 2013-08-26 LAB — COMPREHENSIVE METABOLIC PANEL
Albumin: 4.2 g/dL (ref 3.5–5.2)
Alkaline Phosphatase: 87 U/L (ref 39–117)
CO2: 26 mEq/L (ref 19–32)
Calcium: 9 mg/dL (ref 8.4–10.5)
Chloride: 107 mEq/L (ref 96–112)
Glucose, Bld: 84 mg/dL (ref 70–99)
Potassium: 4.2 mEq/L (ref 3.5–5.3)
Sodium: 141 mEq/L (ref 135–145)
Total Protein: 7 g/dL (ref 6.0–8.3)

## 2013-08-26 NOTE — Patient Instructions (Addendum)
OV in 4 weeks with CBC, CMET, and HCV RNA quaint. CBC,CMET, and urine pregancy today.
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
## 2013-08-27 ENCOUNTER — Encounter (INDEPENDENT_AMBULATORY_CARE_PROVIDER_SITE_OTHER): Payer: Self-pay

## 2013-08-27 LAB — CBC WITH DIFFERENTIAL/PLATELET
Hemoglobin: 12.1 g/dL (ref 12.0–15.0)
Lymphs Abs: 2.2 10*3/uL (ref 0.7–4.0)
Monocytes Relative: 6 % (ref 3–12)
Neutro Abs: 6.7 10*3/uL (ref 1.7–7.7)
Neutrophils Relative %: 69 % (ref 43–77)
RBC: 4.1 MIL/uL (ref 3.87–5.11)

## 2013-08-27 LAB — PREGNANCY, URINE: Preg Test, Ur: NEGATIVE

## 2013-09-05 ENCOUNTER — Telehealth (INDEPENDENT_AMBULATORY_CARE_PROVIDER_SITE_OTHER): Payer: Self-pay | Admitting: *Deleted

## 2013-09-05 DIAGNOSIS — B1920 Unspecified viral hepatitis C without hepatic coma: Secondary | ICD-10-CM

## 2013-09-05 NOTE — Telephone Encounter (Signed)
Patient was seen in the office on 08/26/13 ,per Delrae Rend the patient will need to have lab work drawn 4 weeks, this would be 09/23/13.

## 2013-09-12 ENCOUNTER — Encounter (INDEPENDENT_AMBULATORY_CARE_PROVIDER_SITE_OTHER): Payer: Self-pay | Admitting: *Deleted

## 2013-09-12 ENCOUNTER — Other Ambulatory Visit (INDEPENDENT_AMBULATORY_CARE_PROVIDER_SITE_OTHER): Payer: Self-pay | Admitting: *Deleted

## 2013-09-12 DIAGNOSIS — B1920 Unspecified viral hepatitis C without hepatic coma: Secondary | ICD-10-CM

## 2013-09-20 LAB — CBC WITH DIFFERENTIAL/PLATELET
Basophils Absolute: 0 10*3/uL (ref 0.0–0.1)
Eosinophils Relative: 2 % (ref 0–5)
Lymphocytes Relative: 27 % (ref 12–46)
MCV: 92.8 fL (ref 78.0–100.0)
Platelets: 348 10*3/uL (ref 150–400)
RDW: 13.9 % (ref 11.5–15.5)
WBC: 8.4 10*3/uL (ref 4.0–10.5)

## 2013-09-21 LAB — COMPREHENSIVE METABOLIC PANEL
ALT: 12 U/L (ref 0–35)
AST: 12 U/L (ref 0–37)
Calcium: 8.7 mg/dL (ref 8.4–10.5)
Chloride: 107 mEq/L (ref 96–112)
Creat: 0.95 mg/dL (ref 0.50–1.10)
Total Bilirubin: 0.5 mg/dL (ref 0.3–1.2)

## 2013-09-23 ENCOUNTER — Ambulatory Visit (INDEPENDENT_AMBULATORY_CARE_PROVIDER_SITE_OTHER): Payer: Medicare FFS | Admitting: Internal Medicine

## 2013-09-26 ENCOUNTER — Ambulatory Visit (INDEPENDENT_AMBULATORY_CARE_PROVIDER_SITE_OTHER): Payer: Medicare FFS | Admitting: Internal Medicine

## 2013-09-27 ENCOUNTER — Telehealth (INDEPENDENT_AMBULATORY_CARE_PROVIDER_SITE_OTHER): Payer: Self-pay | Admitting: *Deleted

## 2013-09-27 ENCOUNTER — Encounter (INDEPENDENT_AMBULATORY_CARE_PROVIDER_SITE_OTHER): Payer: Self-pay | Admitting: *Deleted

## 2013-09-27 ENCOUNTER — Ambulatory Visit (INDEPENDENT_AMBULATORY_CARE_PROVIDER_SITE_OTHER): Payer: Medicare FFS | Admitting: Internal Medicine

## 2013-09-27 ENCOUNTER — Encounter (INDEPENDENT_AMBULATORY_CARE_PROVIDER_SITE_OTHER): Payer: Self-pay | Admitting: Internal Medicine

## 2013-09-27 VITALS — BP 112/62 | HR 60 | Temp 98.0°F | Ht 66.0 in | Wt 154.8 lb

## 2013-09-27 DIAGNOSIS — B182 Chronic viral hepatitis C: Secondary | ICD-10-CM

## 2013-09-27 DIAGNOSIS — B1920 Unspecified viral hepatitis C without hepatic coma: Secondary | ICD-10-CM

## 2013-09-27 NOTE — Progress Notes (Signed)
Subjective:     Patient ID: Shannon Chandler, female   DOB: 02/14/1966, 47 y.o.   MRN: [id number]  HPI Here today for f/u of her Hep. C. She is Genotype 3. She started treatment 06/20/2013. She will finish 12/07/2013.  12/31/2012 Liver biopsy shows termination grade 1-2 and fibrosis 0-1     She is taking Solvaldi 400mg  and Ribavirin . This is week 14 for here. She tells me she is doing good. She has been fighting depression and her medications have been changed. She denies suicidal thoughts.  There ha been no rash.  She does c/o some fatigue. No fever. She tells me for the most part she feels good.   CMP     Component Value Date/Time   NA 141 09/20/2013 1323   K 3.9 09/20/2013 1323   CL 107 09/20/2013 1323   CO2 24 09/20/2013 1323   GLUCOSE 86 09/20/2013 1323   BUN 11 09/20/2013 1323   CREATININE 0.95 09/20/2013 1323   CALCIUM 8.7 09/20/2013 1323   PROT 6.6 09/20/2013 1323   ALBUMIN 4.0 09/20/2013 1323   AST 12 09/20/2013 1323   ALT 12 09/20/2013 1323   ALKPHOS 79 09/20/2013 1323   BILITOT 0.5 09/20/2013 1323     CBC    Component Value Date/Time   WBC 8.4 09/20/2013 1319   RBC 4.01 09/20/2013 1319   HGB 11.9* 09/20/2013 1319   HCT 37.2 09/20/2013 1319   PLT 348 09/20/2013 1319   MCV 92.8 09/20/2013 1319   MCH 29.7 09/20/2013 1319   MCHC 32.0 09/20/2013 1319   RDW 13.9 09/20/2013 1319   LYMPHSABS 2.3 09/20/2013 1319   MONOABS 0.5 09/20/2013 1319   EOSABS 0.1 09/20/2013 1319   BASOSABS 0.0 09/20/2013 1319  ANC 5.3 09/20/2013 Quaint undetectable  CBC    Review of Systems see hpi Current Outpatient Prescriptions  Medication Sig Dispense Refill  . acyclovir (ZOVIRAX) 800 MG tablet Take 800 mg by mouth as needed.       . budesonide-formoterol (SYMBICORT) 160-4.5 MCG/ACT inhaler Inhale 2 puffs into the lungs 2 (two) times daily.      . busPIRone (BUSPAR) 10 MG tablet Take 10 mg by mouth 2 (two) times daily before a meal.       . cyclobenzaprine (FLEXERIL) 10  MG tablet Take 10 mg by mouth 3 (three) times daily as needed for muscle spasms.       . DULoxetine (CYMBALTA) 60 MG capsule Take 60 mg by mouth daily.      . Fluticasone-Salmeterol (ADVAIR) 250-50 MCG/DOSE AEPB Inhale 1 puff into the lungs every 12 (twelve) hours.      . gabapentin (NEURONTIN) 300 MG capsule Take 300 mg by mouth at bedtime.      Marland Kitchen loratadine (CLARITIN) 10 MG tablet Take 10 mg by mouth daily.      . mometasone (ASMANEX 120 METERED DOSES) 220 MCG/INH inhaler Inhale 2 puffs into the lungs daily.      . norethindrone (AYGESTIN) 5 MG tablet Take 5 mg by mouth daily.      . pantoprazole (PROTONIX) 40 MG tablet Take 40 mg by mouth daily.      . prazosin (MINIPRESS) 2 MG capsule Take 2 mg by mouth at bedtime. 4mg  at night      . Prenatal Vit-Fe Sulfate-FA (PRENATAL VITAMIN PO) Take 1 tablet by mouth daily.       Marland Kitchen RIBAVIRIN PO Take 200 mg by mouth. Two in am and 3 in pm      .  Sofosbuvir (SOVALDI) 400 MG TABS Take by mouth.      . topiramate (TOPAMAX) 25 MG capsule Take 25 mg by mouth at bedtime.      . traMADol (ULTRAM) 50 MG tablet Take 50 mg by mouth every 6 (six) hours as needed for pain.       Marland Kitchen zolmitriptan (ZOMIG) 5 MG tablet Take 5 mg by mouth as needed for migraine.      Marland Kitchen zolpidem (AMBIEN) 10 MG tablet Take 10 mg by mouth at bedtime as needed for sleep.       No current facility-administered medications for this visit.   Past Medical History  Diagnosis Date  . PTSD (post-traumatic stress disorder)   . Depression   . Bipolar 1 disorder   . Fibromyalgia   . Memory loss   . Hepatitis C    Past Surgical History  Procedure Laterality Date  . Appendectomy    . Tonsillecdtomy    . Double mastectomy for famiy hx      4 yrs ago   Allergies  Allergen Reactions  . Sulfa Antibiotics     Unknown  . Zoloft [Sertraline Hcl]     Unknown           Objective:   Physical Exam  Filed Vitals:   09/27/13 0916  BP: 112/62  Pulse: 60  Temp: 98 F (36.7 C)  Height:  5\' 6"  (1.676 m)  Weight: 154 lb 12.8 oz (70.217 kg)   Alert and oriented. Skin warm and dry. Oral mucosa is moist.   . Sclera anicteric, conjunctivae is pink. Thyroid not enlarged. No cervical lymphadenopathy. Lungs clear. Heart regular rate and rhythm.  Abdomen is soft. Bowel sounds are positive. No hepatomegaly. No abdominal masses felt. No tenderness.  No edema to lower extremities.       Assessment:    Hepatitis C. She has cleared the virus. She seems to be doing well.  No adverse effects from medications    Plan:     OV in 1 month with a CBC and CMET

## 2013-09-27 NOTE — Patient Instructions (Signed)
OV in 1 month.  

## 2013-09-27 NOTE — Telephone Encounter (Signed)
.  Per Delrae Rend patient will need to have lab work in 3 weeks.

## 2013-10-09 ENCOUNTER — Encounter (INDEPENDENT_AMBULATORY_CARE_PROVIDER_SITE_OTHER): Payer: Self-pay | Admitting: *Deleted

## 2013-10-18 LAB — CBC WITH DIFFERENTIAL/PLATELET
Eosinophils Absolute: 0.2 10*3/uL (ref 0.0–0.7)
Eosinophils Relative: 2 % (ref 0–5)
HCT: 34.9 % — ABNORMAL LOW (ref 36.0–46.0)
Hemoglobin: 11.6 g/dL — ABNORMAL LOW (ref 12.0–15.0)
Lymphs Abs: 2.5 10*3/uL (ref 0.7–4.0)
MCH: 30.9 pg (ref 26.0–34.0)
MCV: 92.8 fL (ref 78.0–100.0)
Monocytes Absolute: 0.5 10*3/uL (ref 0.1–1.0)
Monocytes Relative: 5 % (ref 3–12)
RBC: 3.76 MIL/uL — ABNORMAL LOW (ref 3.87–5.11)

## 2013-10-18 LAB — COMPREHENSIVE METABOLIC PANEL
CO2: 20 mEq/L (ref 19–32)
Calcium: 8.9 mg/dL (ref 8.4–10.5)
Creat: 0.82 mg/dL (ref 0.50–1.10)
Glucose, Bld: 96 mg/dL (ref 70–99)
Total Bilirubin: 0.4 mg/dL (ref 0.3–1.2)

## 2013-10-28 ENCOUNTER — Ambulatory Visit (INDEPENDENT_AMBULATORY_CARE_PROVIDER_SITE_OTHER): Payer: Medicare FFS | Admitting: Internal Medicine

## 2013-10-30 ENCOUNTER — Encounter (INDEPENDENT_AMBULATORY_CARE_PROVIDER_SITE_OTHER): Payer: Self-pay | Admitting: Internal Medicine

## 2013-10-30 ENCOUNTER — Ambulatory Visit (INDEPENDENT_AMBULATORY_CARE_PROVIDER_SITE_OTHER): Payer: Medicare FFS | Admitting: Internal Medicine

## 2013-10-30 VITALS — BP 90/52 | HR 60 | Temp 98.4°F | Ht 66.0 in | Wt 154.8 lb

## 2013-10-30 DIAGNOSIS — R768 Other specified abnormal immunological findings in serum: Secondary | ICD-10-CM

## 2013-10-30 DIAGNOSIS — R894 Abnormal immunological findings in specimens from other organs, systems and tissues: Secondary | ICD-10-CM

## 2013-10-30 NOTE — Progress Notes (Signed)
Subjective:     Patient ID: Shannon Chandler, female   DOB: 1966-05-01, 47 y.o.   MRN: [id number]  HPI Here today forHPI Here today for f/u of her Hep. C. She is Genotype 3. She started treatment 06/20/2013. She will finish 12/07/2013.  This is week 19 for her.   She is taking Solvaldi 400mg  and Ribavirin .   She tells me she is doing good.  Really know depression.  She denies suicidal thoughts.  There has been no rash. She does c/o some fatigue.  No fever. Appetite has remained good. No weight loss. She went to [place] in [place] and she underwent an EGD./ED and placed on Zantac 150mg  daily. This ha helped.   She will bring those results to me.  ANA 7.1. CBC    Component Value Date/Time   WBC 10.3 10/18/2013 1434   RBC 3.76* 10/18/2013 1434   HGB 11.6* 10/18/2013 1434   HCT 34.9* 10/18/2013 1434   PLT 356 10/18/2013 1434   MCV 92.8 10/18/2013 1434   MCH 30.9 10/18/2013 1434   MCHC 33.2 10/18/2013 1434   RDW 14.2 10/18/2013 1434   LYMPHSABS 2.5 10/18/2013 1434   MONOABS 0.5 10/18/2013 1434   EOSABS 0.2 10/18/2013 1434   BASOSABS 0.0 10/18/2013 1434   CMP     Component Value Date/Time   NA 139 10/18/2013 1436   K 3.9 10/18/2013 1436   CL 108 10/18/2013 1436   CO2 20 10/18/2013 1436   GLUCOSE 96 10/18/2013 1436   BUN 8 10/18/2013 1436   CREATININE 0.82 10/18/2013 1436   CALCIUM 8.9 10/18/2013 1436   PROT 6.6 10/18/2013 1436   ALBUMIN 4.1 10/18/2013 1436   AST 9 10/18/2013 1436   ALT <8 10/18/2013 1436   ALKPHOS 87 10/18/2013 1436   BILITOT 0.4 10/18/2013 1436    Hep C quaint undetectable. 10/18/2013      Review of Systems see hpi Current outpatient prescriptions:acyclovir (ZOVIRAX) 800 MG tablet, Take 800 mg by mouth as needed. , Disp: , Rfl: ;  budesonide-formoterol (SYMBICORT) 160-4.5 MCG/ACT inhaler, Inhale 2 puffs into the lungs 2 (two) times daily., Disp: , Rfl: ;  busPIRone (BUSPAR) 10 MG tablet, Take 10 mg by mouth 2 (two) times daily before a meal. ,  Disp: , Rfl:  cyclobenzaprine (FLEXERIL) 10 MG tablet, Take 10 mg by mouth 3 (three) times daily as needed for muscle spasms. , Disp: , Rfl: ;  DULoxetine (CYMBALTA) 60 MG capsule, Take 60 mg by mouth daily., Disp: , Rfl: ;  Fluticasone-Salmeterol (ADVAIR) 250-50 MCG/DOSE AEPB, Inhale 1 puff into the lungs every 12 (twelve) hours., Disp: , Rfl: ;  gabapentin (NEURONTIN) 300 MG capsule, Take 300 mg by mouth at bedtime., Disp: , Rfl:  loratadine (CLARITIN) 10 MG tablet, Take 10 mg by mouth daily., Disp: , Rfl: ;  mometasone (ASMANEX 120 METERED DOSES) 220 MCG/INH inhaler, Inhale 2 puffs into the lungs daily., Disp: , Rfl: ;  norethindrone (AYGESTIN) 5 MG tablet, Take 5 mg by mouth daily., Disp: , Rfl: ;  pantoprazole (PROTONIX) 40 MG tablet, Take 40 mg by mouth daily., Disp: , Rfl: ;  prazosin (MINIPRESS) 2 MG capsule, Take 2 mg by mouth at bedtime. 4mg  at night, Disp: , Rfl:  Prenatal Vit-Fe Sulfate-FA (PRENATAL VITAMIN PO), Take 1 tablet by mouth daily. , Disp: , Rfl: ;  RIBAVIRIN PO, Take 200 mg by mouth. Two in am and 3 in pm, Disp: , Rfl: ;  Sofosbuvir (SOVALDI) 400 MG TABS, Take  by mouth., Disp: , Rfl: ;  topiramate (TOPAMAX) 25 MG capsule, Take 25 mg by mouth at bedtime., Disp: , Rfl: ;  traMADol (ULTRAM) 50 MG tablet, Take 50 mg by mouth every 6 (six) hours as needed for pain. , Disp: , Rfl:  zolmitriptan (ZOMIG) 5 MG tablet, Take 5 mg by mouth as needed for migraine., Disp: , Rfl: ;  zolpidem (AMBIEN) 10 MG tablet, Take 10 mg by mouth at bedtime as needed for sleep., Disp: , Rfl: \ Past Medical History  Diagnosis Date  . PTSD (post-traumatic stress disorder)   . Depression   . Bipolar 1 disorder   . Fibromyalgia   . Memory loss   . Hepatitis C    Past Surgical History  Procedure Laterality Date  . Appendectomy    . Tonsillecdtomy    . Double mastectomy for famiy hx      4 yrs ago        Objective:   Physical Exam  Filed Vitals:   10/30/13 1057  BP: 90/52  Pulse: 60  Temp: 98.4 F  (36.9 C)  Height: 5\' 6"  (1.676 m)  Weight: 154 lb 12.8 oz (70.217 kg)  Alert and oriented. Skin warm and dry. Oral mucosa is moist.   . Sclera anicteric, conjunctivae is pink. Thyroid not enlarged. No cervical lymphadenopathy. Lungs clear. Heart regular rate and rhythm.  Abdomen is soft. Bowel sounds are positive. No hepatomegaly. No abdominal masses felt. No tenderness.  No edema to lower extremities.       Assessment:    Hep C. She has cleared the virus. She is doing well.    Plan:     OV in 5 weeks. Continue present medications. CBC,Hep profile, TSH, Hep C quaint in 4 weeks

## 2013-10-30 NOTE — Patient Instructions (Signed)
OV in 5 weeks. Continue present medication.

## 2013-10-31 ENCOUNTER — Encounter (INDEPENDENT_AMBULATORY_CARE_PROVIDER_SITE_OTHER): Payer: Self-pay | Admitting: *Deleted

## 2013-10-31 ENCOUNTER — Other Ambulatory Visit (INDEPENDENT_AMBULATORY_CARE_PROVIDER_SITE_OTHER): Payer: Self-pay | Admitting: *Deleted

## 2013-10-31 ENCOUNTER — Telehealth (INDEPENDENT_AMBULATORY_CARE_PROVIDER_SITE_OTHER): Payer: Self-pay | Admitting: *Deleted

## 2013-10-31 DIAGNOSIS — B1920 Unspecified viral hepatitis C without hepatic coma: Secondary | ICD-10-CM

## 2013-10-31 NOTE — Telephone Encounter (Signed)
.  Per Delrae Rend the patient will need to have labs in 4 weeks . These are noted for 11/28/13.

## 2013-11-28 LAB — CBC
HCT: 38.1 % (ref 36.0–46.0)
Hemoglobin: 12.2 g/dL (ref 12.0–15.0)
MCH: 30 pg (ref 26.0–34.0)
MCHC: 32 g/dL (ref 30.0–36.0)
MCV: 93.6 fL (ref 78.0–100.0)
Platelets: 340 10*3/uL (ref 150–400)
RBC: 4.07 MIL/uL (ref 3.87–5.11)
RDW: 12.6 % (ref 11.5–15.5)
WBC: 7.1 10*3/uL (ref 4.0–10.5)

## 2013-11-28 LAB — HEPATIC FUNCTION PANEL
ALT: 10 U/L (ref 0–35)
AST: 14 U/L (ref 0–37)
Albumin: 3.9 g/dL (ref 3.5–5.2)
Alkaline Phosphatase: 83 U/L (ref 39–117)
Bilirubin, Direct: 0.1 mg/dL (ref 0.0–0.3)
Indirect Bilirubin: 0.3 mg/dL (ref 0.0–0.9)
Total Bilirubin: 0.4 mg/dL (ref 0.3–1.2)
Total Protein: 6.5 g/dL (ref 6.0–8.3)

## 2013-11-29 LAB — HEPATITIS C RNA QUANTITATIVE: HCV Quantitative: NOT DETECTED IU/mL (ref <15)

## 2013-11-29 LAB — TSH: TSH: 3.69 u[IU]/mL (ref 0.350–4.500)

## 2013-12-02 ENCOUNTER — Telehealth (INDEPENDENT_AMBULATORY_CARE_PROVIDER_SITE_OTHER): Payer: Self-pay | Admitting: Internal Medicine

## 2013-12-02 NOTE — Telephone Encounter (Signed)
Message left at home 

## 2013-12-04 ENCOUNTER — Ambulatory Visit (INDEPENDENT_AMBULATORY_CARE_PROVIDER_SITE_OTHER): Payer: Medicare FFS | Admitting: Internal Medicine

## 2013-12-06 ENCOUNTER — Encounter (INDEPENDENT_AMBULATORY_CARE_PROVIDER_SITE_OTHER): Payer: Self-pay | Admitting: Internal Medicine

## 2013-12-06 ENCOUNTER — Ambulatory Visit (INDEPENDENT_AMBULATORY_CARE_PROVIDER_SITE_OTHER): Payer: Medicare FFS | Admitting: Internal Medicine

## 2013-12-06 VITALS — BP 102/60 | HR 84 | Temp 98.1°F | Ht 66.0 in | Wt 157.7 lb

## 2013-12-06 DIAGNOSIS — B1920 Unspecified viral hepatitis C without hepatic coma: Secondary | ICD-10-CM

## 2013-12-06 NOTE — Progress Notes (Signed)
Subjective:     Patient ID: Shannon CapersSheila Littrell, female   DOB: [birth year], 48 y.o.   MRN: [id number]  HPI Here today for f/u of her Hepatitis C. Diagnosed with Hepatitis C 3 yrs ago. She was last seen 10/30/2013 HPI    She is Genotype 3. She started treatment 06/20/2013. She will finish 12/07/2013. This is week 24 for her.    She is taking Solvaldi 400mg  and Ribavirin 1000mcg.  She tells me she is doing good. No depression..  She denies suicidal thoughts.  There has been no rash. She does c/o some fatigue.  No fever.  Appetite has remained good. No weight loss. BMs are normal. No melena or bright red rectal bleeding. She went to [facility] in [place] and she underwent an EGD./ED and placed on Zantac 150mg  daily. She had dysphagia. This has helped.  She will bring those results to me.     Hepatic Function Panel     Component Value Date/Time   PROT 6.5 11/28/2013 1103   ALBUMIN 3.9 11/28/2013 1103   AST 14 11/28/2013 1103   ALT 10 11/28/2013 1103   ALKPHOS 83 11/28/2013 1103   BILITOT 0.4 11/28/2013 1103   BILIDIR 0.1 11/28/2013 1103   IBILI 0.3 11/28/2013 1103     CBC    Component Value Date/Time   WBC 7.1 11/28/2013 1105   RBC 4.07 11/28/2013 1105   HGB 12.2 11/28/2013 1105   HCT 38.1 11/28/2013 1105   PLT 340 11/28/2013 1105   MCV 93.6 11/28/2013 1105   MCH 30.0 11/28/2013 1105   MCHC 32.0 11/28/2013 1105   RDW 12.6 11/28/2013 1105   LYMPHSABS 2.5 10/18/2013 1434   MONOABS 0.5 10/18/2013 1434   EOSABS 0.2 10/18/2013 1434   BASOSABS 0.0 10/18/2013 1434    CMP     Component Value Date/Time   NA 139 10/18/2013 1436   K 3.9 10/18/2013 1436   CL 108 10/18/2013 1436   CO2 20 10/18/2013 1436   GLUCOSE 96 10/18/2013 1436   BUN 8 10/18/2013 1436   CREATININE 0.82 10/18/2013 1436   CALCIUM 8.9 10/18/2013 1436   PROT 6.5 11/28/2013 1103   ALBUMIN 3.9 11/28/2013 1103   AST 14 11/28/2013 1103   ALT 10 11/28/2013 1103   ALKPHOS 83 11/28/2013 1103   BILITOT 0.4 11/28/2013 1103   11/28/2013 TSH 3.69 Hep C Quaint  undetectable.  01/2013 Liver Biopsy Genotype 3.  12/31/2012 Liver biopsy shows termination grade 1-2 and fibrosis 0-1 Results reviewed with patient.     . Review of Systems see hpi Current Outpatient Prescriptions  Medication Sig Dispense Refill  . acyclovir (ZOVIRAX) 800 MG tablet Take 800 mg by mouth as needed.       . budesonide-formoterol (SYMBICORT) 160-4.5 MCG/ACT inhaler Inhale 2 puffs into the lungs 2 (two) times daily.      . busPIRone (BUSPAR) 10 MG tablet Take 10 mg by mouth 2 (two) times daily before a meal.       . cyclobenzaprine (FLEXERIL) 10 MG tablet Take 10 mg by mouth 3 (three) times daily as needed for muscle spasms.       . DULoxetine (CYMBALTA) 60 MG capsule Take 60 mg by mouth daily.      . Fluticasone-Salmeterol (ADVAIR) 250-50 MCG/DOSE AEPB Inhale 1 puff into the lungs every 12 (twelve) hours.      . gabapentin (NEURONTIN) 300 MG capsule Take 300 mg by mouth at bedtime.      Marland Kitchen. loratadine (CLARITIN) 10 MG  tablet Take 10 mg by mouth daily.      . mometasone (ASMANEX 120 METERED DOSES) 220 MCG/INH inhaler Inhale 2 puffs into the lungs daily.      . norethindrone (AYGESTIN) 5 MG tablet Take 5 mg by mouth daily.      . pantoprazole (PROTONIX) 40 MG tablet Take 40 mg by mouth daily.      . prazosin (MINIPRESS) 2 MG capsule Take 2 mg by mouth at bedtime. 4mg  at night      . Prenatal Vit-Fe Sulfate-FA (PRENATAL VITAMIN PO) Take 1 tablet by mouth daily.       . ranitidine (ZANTAC) 150 MG tablet Take 150 mg by mouth 2 (two) times daily.      Marland Kitchen RIBAVIRIN PO Take 200 mg by mouth. Two in am and 3 in pm      . Sofosbuvir (SOVALDI) 400 MG TABS Take by mouth.      . topiramate (TOPAMAX) 25 MG capsule Take 25 mg by mouth at bedtime.      . traMADol (ULTRAM) 50 MG tablet Take 50 mg by mouth every 6 (six) hours as needed for pain.       Marland Kitchen zolmitriptan (ZOMIG) 5 MG tablet Take 5 mg by mouth as needed for migraine.      Marland Kitchen zolpidem (AMBIEN) 10 MG tablet Take 10 mg by mouth at bedtime  as needed for sleep.       No current facility-administered medications for this visit.   Past Medical History  Diagnosis Date  . PTSD (post-traumatic stress disorder)   . Depression   . Bipolar 1 disorder   . Fibromyalgia   . Memory loss   . Hepatitis C    Past Surgical History  Procedure Laterality Date  . Appendectomy    . Tonsillecdtomy    . Double mastectomy for famiy hx      4 yrs ago   Allergies  Allergen Reactions  . Sulfa Antibiotics     Unknown  . Zoloft [Sertraline Hcl]     Unknown       Objective:   Physical Exam  Filed Vitals:   12/06/13 0905  BP: 102/60  Pulse: 84  Temp: 98.1 F (36.7 C)  Height: 5\' 6"  (1.676 m)  Weight: 157 lb 11.2 oz (71.532 kg)   Alert and oriented. Skin warm and dry. Oral mucosa is moist.   . Sclera anicteric, conjunctivae is pink. Thyroid not enlarged. No cervical lymphadenopathy. Lungs clear. Heart regular rate and rhythm.  Abdomen is soft. Bowel sounds are positive. No hepatomegaly. No abdominal masses felt. No tenderness.  No edema to lower extremities. Patient is alert and oriented.    Assessment:    Hepatitis C. She has cleared the virus. She is doing well.     Plan:     OV in 6 months with a Hep C quaint, Liver profile, and CBC.

## 2013-12-06 NOTE — Patient Instructions (Signed)
OV in 6 months with a CBC, Liver Profile, Hep C Quaint.

## 2013-12-09 ENCOUNTER — Ambulatory Visit (INDEPENDENT_AMBULATORY_CARE_PROVIDER_SITE_OTHER): Payer: Medicare FFS | Admitting: Internal Medicine

## 2013-12-19 ENCOUNTER — Telehealth (INDEPENDENT_AMBULATORY_CARE_PROVIDER_SITE_OTHER): Payer: Self-pay | Admitting: *Deleted

## 2013-12-19 DIAGNOSIS — B1920 Unspecified viral hepatitis C without hepatic coma: Secondary | ICD-10-CM

## 2013-12-19 NOTE — Telephone Encounter (Signed)
.  Per Delrae Rend the patient will need lab work in 6 months.

## 2014-03-05 ENCOUNTER — Encounter (HOSPITAL_COMMUNITY): Payer: Self-pay | Admitting: Emergency Medicine

## 2014-03-05 ENCOUNTER — Emergency Department (HOSPITAL_COMMUNITY): Payer: No Typology Code available for payment source

## 2014-03-05 ENCOUNTER — Emergency Department (HOSPITAL_COMMUNITY)
Admission: EM | Admit: 2014-03-05 | Discharge: 2014-03-05 | Disposition: A | Discharge status: Home / Self Care | Payer: No Typology Code available for payment source | Attending: Emergency Medicine | Admitting: Emergency Medicine

## 2014-03-05 DIAGNOSIS — F319 Bipolar disorder, unspecified: Secondary | ICD-10-CM | POA: Insufficient documentation

## 2014-03-05 DIAGNOSIS — Y92410 Unspecified street and highway as the place of occurrence of the external cause: Secondary | ICD-10-CM | POA: Insufficient documentation

## 2014-03-05 DIAGNOSIS — Y9389 Activity, other specified: Secondary | ICD-10-CM | POA: Insufficient documentation

## 2014-03-05 DIAGNOSIS — F4310 Post-traumatic stress disorder, unspecified: Secondary | ICD-10-CM | POA: Diagnosis not present

## 2014-03-05 DIAGNOSIS — S199XXA Unspecified injury of neck, initial encounter: Secondary | ICD-10-CM | POA: Diagnosis present

## 2014-03-05 DIAGNOSIS — Z79899 Other long term (current) drug therapy: Secondary | ICD-10-CM | POA: Insufficient documentation

## 2014-03-05 DIAGNOSIS — S139XXA Sprain of joints and ligaments of unspecified parts of neck, initial encounter: Secondary | ICD-10-CM | POA: Diagnosis not present

## 2014-03-05 DIAGNOSIS — Z8619 Personal history of other infectious and parasitic diseases: Secondary | ICD-10-CM | POA: Insufficient documentation

## 2014-03-05 DIAGNOSIS — S0993XA Unspecified injury of face, initial encounter: Secondary | ICD-10-CM | POA: Diagnosis present

## 2014-03-05 DIAGNOSIS — IMO0001 Reserved for inherently not codable concepts without codable children: Secondary | ICD-10-CM | POA: Insufficient documentation

## 2014-03-05 DIAGNOSIS — IMO0002 Reserved for concepts with insufficient information to code with codable children: Secondary | ICD-10-CM | POA: Insufficient documentation

## 2014-03-05 DIAGNOSIS — S60229A Contusion of unspecified hand, initial encounter: Secondary | ICD-10-CM | POA: Insufficient documentation

## 2014-03-05 DIAGNOSIS — S0990XA Unspecified injury of head, initial encounter: Secondary | ICD-10-CM | POA: Insufficient documentation

## 2014-03-05 DIAGNOSIS — S161XXA Strain of muscle, fascia and tendon at neck level, initial encounter: Secondary | ICD-10-CM

## 2014-03-05 MED ORDER — METHOCARBAMOL 500 MG PO TABS: 500.0000 mg | ORAL_TABLET | Freq: Two times a day (BID) | ORAL | Status: DC

## 2014-03-05 MED ORDER — IBUPROFEN 800 MG PO TABS
800.0000 mg | ORAL_TABLET | Freq: Once | ORAL | Status: AC
  Administered 2014-03-05: 800 mg via ORAL
  Filled 2014-03-05: qty 1

## 2014-03-05 MED ORDER — CYCLOBENZAPRINE HCL 10 MG PO TABS
10.0000 mg | ORAL_TABLET | Freq: Three times a day (TID) | ORAL | Status: DC | PRN
  Administered 2014-03-05: 10 mg via ORAL
  Filled 2014-03-05: qty 1

## 2014-03-05 MED ORDER — NAPROXEN 500 MG PO TABS: 500.0000 mg | ORAL_TABLET | Freq: Two times a day (BID) | ORAL | Status: DC

## 2014-03-05 NOTE — Discharge Instructions (Signed)
Cervical Sprain A cervical sprain is an injury in the neck in which the strong, fibrous tissues (ligaments) that connect your neck bones stretch or tear. Cervical sprains can range from mild to severe. Severe cervical sprains can cause the neck vertebrae to be unstable. This can lead to damage of the spinal cord and can result in serious nervous system problems. The amount of time it takes for a cervical sprain to get better depends on the cause and extent of the injury. Most cervical sprains heal in 1 to 3 weeks. CAUSES  Severe cervical sprains may be caused by:   Contact sport injuries (such as from football, rugby, wrestling, hockey, auto racing, gymnastics, diving, martial arts, or boxing).   Motor vehicle collisions.   Whiplash injuries. This is an injury from a sudden forward-and backward whipping movement of the head and neck.  Falls.  Mild cervical sprains may be caused by:   Being in an awkward position, such as while cradling a telephone between your ear and shoulder.   Sitting in a chair that does not offer proper support.   Working at a poorly designed computer station.   Looking up or down for long periods of time.  SYMPTOMS   Pain, soreness, stiffness, or a burning sensation in the front, back, or sides of the neck. This discomfort may develop immediately after the injury or slowly, 24 hours or more after the injury.   Pain or tenderness directly in the middle of the back of the neck.   Shoulder or upper back pain.   Limited ability to move the neck.   Headache.   Dizziness.   Weakness, numbness, or tingling in the hands or arms.   Muscle spasms.   Difficulty swallowing or chewing.   Tenderness and swelling of the neck.  DIAGNOSIS  Most of the time your health care provider can diagnose a cervical sprain by taking your history and doing a physical exam. Your health care provider will ask about previous neck injuries and any known neck  problems, such as arthritis in the neck. X-rays may be taken to find out if there are any other problems, such as with the bones of the neck. Other tests, such as a CT scan or MRI, may also be needed.  TREATMENT  Treatment depends on the severity of the cervical sprain. Mild sprains can be treated with rest, keeping the neck in place (immobilization), and pain medicines. Severe cervical sprains are immediately immobilized. Further treatment is done to help with pain, muscle spasms, and other symptoms and may include:  Medicines, such as pain relievers, numbing medicines, or muscle relaxants.   Physical therapy. This may involve stretching exercises, strengthening exercises, and posture training. Exercises and improved posture can help stabilize the neck, strengthen muscles, and help stop symptoms from returning.  HOME CARE INSTRUCTIONS   Put ice on the injured area.   Put ice in a plastic bag.   Place a towel between your skin and the bag.   Leave the ice on for 15 20 minutes, 3 4 times a day.   If your injury was severe, you may have been given a cervical collar to wear. A cervical collar is a two-piece collar designed to keep your neck from moving while it heals.  Do not remove the collar unless instructed by your health care provider.  If you have long hair, keep it outside of the collar.  Ask your health care provider before making any adjustments to your collar.   Minor adjustments may be required over time to improve comfort and reduce pressure on your chin or on the back of your head.  Ifyou are allowed to remove the collar for cleaning or bathing, follow your health care provider's instructions on how to do so safely.  Keep your collar clean by wiping it with mild soap and water and drying it completely. If the collar you have been given includes removable pads, remove them every 1 2 days and hand wash them with soap and water. Allow them to air dry. They should be completely  dry before you wear them in the collar.  If you are allowed to remove the collar for cleaning and bathing, wash and dry the skin of your neck. Check your skin for irritation or sores. If you see any, tell your health care provider.  Do not drive while wearing the collar.   Only take over-the-counter or prescription medicines for pain, discomfort, or fever as directed by your health care provider.   Keep all follow-up appointments as directed by your health care provider.   Keep all physical therapy appointments as directed by your health care provider.   Make any needed adjustments to your workstation to promote good posture.   Avoid positions and activities that make your symptoms worse.   Warm up and stretch before being active to help prevent problems.  SEEK MEDICAL CARE IF:   Your pain is not controlled with medicine.   You are unable to decrease your pain medicine over time as planned.   Your activity level is not improving as expected.  SEEK IMMEDIATE MEDICAL CARE IF:   You develop any bleeding.  You develop stomach upset.  You have signs of an allergic reaction to your medicine.   Your symptoms get worse.   You develop new, unexplained symptoms.   You have numbness, tingling, weakness, or paralysis in any part of your body.  MAKE SURE YOU:   Understand these instructions.  Will watch your condition.  Will get help right away if you are not doing well or get worse. Document Released: 09/04/2007 Document Revised: 08/28/2013 Document Reviewed: 05/15/2013 [employer] Patient Information 2014 [employer].  
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
## 2014-03-05 NOTE — ED Notes (Signed)
Pt reporting continued pain in neck and shoulders.  No distress noted at present time.

## 2014-04-09 ENCOUNTER — Ambulatory Visit: Payer: No Typology Code available for payment source | Attending: Internal Medicine | Admitting: Physical Therapy

## 2014-04-09 DIAGNOSIS — IMO0001 Reserved for inherently not codable concepts without codable children: Secondary | ICD-10-CM | POA: Insufficient documentation

## 2014-04-09 DIAGNOSIS — M549 Dorsalgia, unspecified: Secondary | ICD-10-CM | POA: Insufficient documentation

## 2014-04-09 DIAGNOSIS — M542 Cervicalgia: Secondary | ICD-10-CM | POA: Diagnosis not present

## 2014-04-17 ENCOUNTER — Ambulatory Visit: Payer: No Typology Code available for payment source

## 2014-04-17 DIAGNOSIS — IMO0001 Reserved for inherently not codable concepts without codable children: Secondary | ICD-10-CM | POA: Diagnosis not present

## 2014-04-22 ENCOUNTER — Encounter: Payer: Medicare FFS | Admitting: Rehabilitation

## 2014-04-23 ENCOUNTER — Ambulatory Visit: Payer: No Typology Code available for payment source | Attending: Internal Medicine | Admitting: Rehabilitation

## 2014-04-23 DIAGNOSIS — IMO0001 Reserved for inherently not codable concepts without codable children: Secondary | ICD-10-CM | POA: Diagnosis not present

## 2014-04-23 DIAGNOSIS — M549 Dorsalgia, unspecified: Secondary | ICD-10-CM | POA: Diagnosis not present

## 2014-04-23 DIAGNOSIS — M542 Cervicalgia: Secondary | ICD-10-CM | POA: Insufficient documentation

## 2014-04-28 ENCOUNTER — Encounter: Payer: Medicare HMO | Admitting: Rehabilitation

## 2014-04-30 ENCOUNTER — Ambulatory Visit: Payer: No Typology Code available for payment source | Admitting: Rehabilitation

## 2014-04-30 DIAGNOSIS — IMO0001 Reserved for inherently not codable concepts without codable children: Secondary | ICD-10-CM | POA: Diagnosis not present

## 2014-05-05 ENCOUNTER — Ambulatory Visit: Payer: No Typology Code available for payment source | Admitting: Physical Therapy

## 2014-05-05 DIAGNOSIS — IMO0001 Reserved for inherently not codable concepts without codable children: Secondary | ICD-10-CM | POA: Diagnosis not present

## 2014-05-07 ENCOUNTER — Encounter (INDEPENDENT_AMBULATORY_CARE_PROVIDER_SITE_OTHER): Payer: Self-pay | Admitting: *Deleted

## 2014-05-07 ENCOUNTER — Other Ambulatory Visit (INDEPENDENT_AMBULATORY_CARE_PROVIDER_SITE_OTHER): Payer: Self-pay | Admitting: *Deleted

## 2014-05-07 ENCOUNTER — Ambulatory Visit: Payer: No Typology Code available for payment source | Admitting: Physical Therapy

## 2014-05-07 DIAGNOSIS — B1920 Unspecified viral hepatitis C without hepatic coma: Secondary | ICD-10-CM

## 2014-05-07 DIAGNOSIS — IMO0001 Reserved for inherently not codable concepts without codable children: Secondary | ICD-10-CM | POA: Diagnosis not present

## 2014-05-13 ENCOUNTER — Encounter: Payer: Medicare FFS | Admitting: Rehabilitation

## 2014-05-15 ENCOUNTER — Ambulatory Visit: Payer: No Typology Code available for payment source | Admitting: Rehabilitation

## 2014-05-15 DIAGNOSIS — IMO0001 Reserved for inherently not codable concepts without codable children: Secondary | ICD-10-CM | POA: Diagnosis not present

## 2014-05-20 ENCOUNTER — Ambulatory Visit: Payer: No Typology Code available for payment source | Admitting: Physical Therapy

## 2014-05-20 DIAGNOSIS — IMO0001 Reserved for inherently not codable concepts without codable children: Secondary | ICD-10-CM | POA: Diagnosis not present

## 2014-06-05 ENCOUNTER — Ambulatory Visit: Payer: No Typology Code available for payment source | Admitting: Physical Therapy

## 2014-06-05 LAB — CBC
HCT: 41.9 % (ref 36.0–46.0)
Hemoglobin: 14.2 g/dL (ref 12.0–15.0)
MCH: 28.6 pg (ref 26.0–34.0)
MCHC: 33.9 g/dL (ref 30.0–36.0)
MCV: 84.5 fL (ref 78.0–100.0)
PLATELETS: 302 10*3/uL (ref 150–400)
RBC: 4.96 MIL/uL (ref 3.87–5.11)
RDW: 13.9 % (ref 11.5–15.5)
WBC: 11.2 10*3/uL — ABNORMAL HIGH (ref 4.0–10.5)

## 2014-06-05 LAB — HEPATIC FUNCTION PANEL
ALBUMIN: 3.8 g/dL (ref 3.5–5.2)
ALK PHOS: 116 U/L (ref 39–117)
ALT: 15 U/L (ref 0–35)
AST: 13 U/L (ref 0–37)
Bilirubin, Direct: <0.1 mg/dL (ref 0.0–0.3)
Total Bilirubin: 0.3 mg/dL (ref 0.2–1.2)
Total Protein: 6.4 g/dL (ref 6.0–8.3)

## 2014-06-10 LAB — HEPATITIS C RNA QUANTITATIVE: HCV Quantitative: NOT DETECTED IU/mL (ref <15)

## 2014-06-20 ENCOUNTER — Encounter: Payer: Medicare FFS | Admitting: Physical Therapy

## 2014-06-23 ENCOUNTER — Ambulatory Visit (INDEPENDENT_AMBULATORY_CARE_PROVIDER_SITE_OTHER): Payer: Medicare FFS | Admitting: Internal Medicine

## 2014-07-17 DIAGNOSIS — Z8619 Personal history of other infectious and parasitic diseases: Secondary | ICD-10-CM | POA: Insufficient documentation

## 2014-07-17 DIAGNOSIS — Z791 Long term (current) use of non-steroidal anti-inflammatories (NSAID): Secondary | ICD-10-CM | POA: Diagnosis not present

## 2014-07-17 DIAGNOSIS — R1110 Vomiting, unspecified: Secondary | ICD-10-CM | POA: Insufficient documentation

## 2014-07-17 DIAGNOSIS — IMO0001 Reserved for inherently not codable concepts without codable children: Secondary | ICD-10-CM | POA: Diagnosis not present

## 2014-07-17 DIAGNOSIS — Z79899 Other long term (current) drug therapy: Secondary | ICD-10-CM | POA: Diagnosis not present

## 2014-07-17 DIAGNOSIS — IMO0002 Reserved for concepts with insufficient information to code with codable children: Secondary | ICD-10-CM | POA: Insufficient documentation

## 2014-07-17 DIAGNOSIS — R51 Headache: Secondary | ICD-10-CM | POA: Diagnosis not present

## 2014-07-17 DIAGNOSIS — Z8659 Personal history of other mental and behavioral disorders: Secondary | ICD-10-CM | POA: Insufficient documentation

## 2014-07-18 ENCOUNTER — Emergency Department (HOSPITAL_COMMUNITY)
Admission: EM | Admit: 2014-07-18 | Discharge: 2014-07-18 | Disposition: A | Discharge status: Home / Self Care | Payer: Medicare FFS | Attending: Emergency Medicine | Admitting: Emergency Medicine

## 2014-07-18 ENCOUNTER — Encounter (HOSPITAL_COMMUNITY): Payer: Self-pay | Admitting: Emergency Medicine

## 2014-07-18 DIAGNOSIS — R51 Headache: Secondary | ICD-10-CM

## 2014-07-18 DIAGNOSIS — R519 Headache, unspecified: Secondary | ICD-10-CM

## 2014-07-18 LAB — COMPREHENSIVE METABOLIC PANEL
ALK PHOS: 143 U/L — AB (ref 39–117)
ALT: 11 U/L (ref 0–35)
AST: 15 U/L (ref 0–37)
Albumin: 4 g/dL (ref 3.5–5.2)
Anion gap: 16 — ABNORMAL HIGH (ref 5–15)
BILIRUBIN TOTAL: 0.5 mg/dL (ref 0.3–1.2)
BUN: 14 mg/dL (ref 6–23)
CHLORIDE: 98 meq/L (ref 96–112)
CO2: 23 meq/L (ref 19–32)
Calcium: 9.7 mg/dL (ref 8.4–10.5)
Creatinine, Ser: 0.81 mg/dL (ref 0.50–1.10)
GFR, EST NON AFRICAN AMERICAN: 85 mL/min — AB (ref >90)
GLUCOSE: 102 mg/dL — AB (ref 70–99)
POTASSIUM: 3.6 meq/L — AB (ref 3.7–5.3)
SODIUM: 137 meq/L (ref 137–147)
Total Protein: 8.2 g/dL (ref 6.0–8.3)

## 2014-07-18 LAB — CBC WITH DIFFERENTIAL/PLATELET
Basophils Absolute: 0.1 10*3/uL (ref 0.0–0.1)
Basophils Relative: 0 % (ref 0–1)
Eosinophils Absolute: 0 10*3/uL (ref 0.0–0.7)
Eosinophils Relative: 0 % (ref 0–5)
HCT: 43.8 % (ref 36.0–46.0)
Hemoglobin: 14.9 g/dL (ref 12.0–15.0)
LYMPHS ABS: 2.8 10*3/uL (ref 0.7–4.0)
Lymphocytes Relative: 19 % (ref 12–46)
MCH: 28.8 pg (ref 26.0–34.0)
MCHC: 34 g/dL (ref 30.0–36.0)
MCV: 84.6 fL (ref 78.0–100.0)
Monocytes Absolute: 0.9 10*3/uL (ref 0.1–1.0)
Monocytes Relative: 6 % (ref 3–12)
NEUTROS ABS: 11.1 10*3/uL — AB (ref 1.7–7.7)
NEUTROS PCT: 75 % (ref 43–77)
PLATELETS: 299 10*3/uL (ref 150–400)
RBC: 5.18 MIL/uL — AB (ref 3.87–5.11)
RDW: 13.1 % (ref 11.5–15.5)
WBC: 14.9 10*3/uL — ABNORMAL HIGH (ref 4.0–10.5)

## 2014-07-18 LAB — I-STAT TROPONIN, ED: Troponin i, poc: 0 ng/mL (ref 0.00–0.08)

## 2014-07-18 MED ORDER — METOCLOPRAMIDE HCL 5 MG/ML IJ SOLN
10.0000 mg | Freq: Once | INTRAMUSCULAR | Status: AC
  Administered 2014-07-18: 10 mg via INTRAVENOUS
  Filled 2014-07-18: qty 2

## 2014-07-18 MED ORDER — SODIUM CHLORIDE 0.9 % IV SOLN: 1000.0000 mL | INTRAVENOUS | Status: DC

## 2014-07-18 MED ORDER — SODIUM CHLORIDE 0.9 % IV SOLN
1000.0000 mL | Freq: Once | INTRAVENOUS | Status: AC
  Administered 2014-07-18: 1000 mL via INTRAVENOUS

## 2014-07-18 MED ORDER — KETOROLAC TROMETHAMINE 60 MG/2ML IM SOLN
60.0000 mg | Freq: Once | INTRAMUSCULAR | Status: AC
  Administered 2014-07-18: 60 mg via INTRAMUSCULAR
  Filled 2014-07-18: qty 2

## 2014-07-18 MED ORDER — KETOROLAC TROMETHAMINE 30 MG/ML IJ SOLN: 30.0000 mg | Freq: Once | INTRAMUSCULAR | Status: DC

## 2014-07-18 MED ORDER — DIPHENHYDRAMINE HCL 50 MG/ML IJ SOLN
25.0000 mg | Freq: Once | INTRAMUSCULAR | Status: AC
  Administered 2014-07-18: 25 mg via INTRAVENOUS
  Filled 2014-07-18: qty 1

## 2014-07-18 MED ORDER — ONDANSETRON 4 MG PO TBDP
8.0000 mg | ORAL_TABLET | Freq: Once | ORAL | Status: AC
Start: 2014-07-18 — End: 2014-07-18
  Administered 2014-07-18: 8 mg via ORAL
  Filled 2014-07-18: qty 2

## 2014-07-18 NOTE — Discharge Instructions (Signed)

## 2014-07-18 NOTE — ED Provider Notes (Signed)
CSN: [id number]     Arrival date & time 07/17/14  2359 History   First MD Initiated Contact with Patient 07/18/14 0320     Chief Complaint  Patient presents with  . Headache  . Emesis     (Consider location/radiation/quality/duration/timing/severity/associated sxs/prior Treatment) Patient is a 48 y.o. female presenting with headaches and vomiting. The history is provided by the patient.  Headache Associated symptoms: vomiting   Emesis Associated symptoms: headaches   She was working in her yard in the heat to the course of the day and states that she thinks she got overheated. She tried to drink a lot of water while she was working but when she came in, she developed a global headache. Headache is throbbing in nature and she rates it had 10/10 although it has subsided slightly to 8/10. There has been associated nausea and vomiting. She tried taking the Cozaar but it has not helped. She denies photophobia or phonophobia. She denies weakness, numbness, tingling. There's been no fever or chills and denies stiff neck. She has had similar headaches to this in the past.  Past Medical History  Diagnosis Date  . PTSD (post-traumatic stress disorder)   . Depression   . Bipolar 1 disorder   . Fibromyalgia   . Memory loss   . Hepatitis C    Past Surgical History  Procedure Laterality Date  . Appendectomy    . Tonsillecdtomy    . Double mastectomy for famiy hx      4 yrs ago   History reviewed. No pertinent family history. History  Substance Use Topics  . Smoking status: Never Smoker   . Smokeless tobacco: Never Used  . Alcohol Use: Yes     Comment: Occasionally etoh, every couple of month   OB History   Grav Para Term Preterm Abortions TAB SAB Ect Mult Living                 Review of Systems  Gastrointestinal: Positive for vomiting.  Neurological: Positive for headaches.  All other systems reviewed and are negative.     Allergies  Gabapentin; Sulfa antibiotics; and  Zoloft  Home Medications   Prior to Admission medications   Medication Sig Start Date End Date Taking? Authorizing Provider  budesonide-formoterol (SYMBICORT) 160-4.5 MCG/ACT inhaler Inhale 2 puffs into the lungs 2 (two) times daily.   Yes Historical Provider, MD  busPIRone (BUSPAR) 10 MG tablet Take 10 mg by mouth 2 (two) times daily before a meal.    Yes Historical Provider, MD  HYDROcodone-acetaminophen (NORCO/VICODIN) 5-325 MG per tablet Take 1 tablet by mouth every 6 (six) hours as needed for moderate pain.   Yes Historical Provider, MD  loratadine (CLARITIN) 10 MG tablet Take 10 mg by mouth daily.   Yes Historical Provider, MD  meloxicam (MOBIC) 15 MG tablet Take 15 mg by mouth daily.   Yes Historical Provider, MD  methocarbamol (ROBAXIN) 500 MG tablet Take 1 tablet (500 mg total) by mouth 2 (two) times daily. 03/05/14  Yes Rolland Porter, MD  mometasone (ASMANEX 120 METERED DOSES) 220 MCG/INH inhaler Inhale 2 puffs into the lungs daily.   Yes Historical Provider, MD  nitroGLYCERIN (NITROSTAT) 0.4 MG SL tablet Place 0.4 mg under the tongue every 5 (five) minutes as needed for chest pain.   Yes Historical Provider, MD  pantoprazole (PROTONIX) 40 MG tablet Take 40 mg by mouth daily.   Yes Historical Provider, MD  prazosin (MINIPRESS) 2 MG capsule Take 4 mg by  mouth at bedtime.  at night   Yes Historical Provider, MD  traMADol (ULTRAM) 50 MG tablet Take 50 mg by mouth every 6 (six) hours as needed for pain.    Yes Historical Provider, MD  zolmitriptan (ZOMIG) 5 MG tablet Take 5 mg by mouth as needed for migraine.   Yes Historical Provider, MD  zolpidem (AMBIEN) 10 MG tablet Take 10 mg by mouth at bedtime as needed for sleep.   Yes Historical Provider, MD   BP 108/85  Pulse 81  Temp(Src) 97.7 F (36.5 C) (Oral)  Resp 14  SpO2 97%  LMP 07/11/2013 Physical Exam  Nursing note and vitals reviewed.  48 year old female, resting comfortably and in no acute distress. Vital signs are significant  for borderline tachycardia. Oxygen saturation is 95%, which is normal. Head is normocephalic and atraumatic. PERRLA, EOMI. Oropharynx is clear. Neck is nontender and supple without adenopathy or JVD. Back is nontender and there is no CVA tenderness. Lungs are clear without rales, wheezes, or rhonchi. Chest is nontender. Heart has regular rate and rhythm without murmur. Abdomen is soft, flat, nontender without masses or hepatosplenomegaly and peristalsis is normoactive. Extremities have no cyanosis or edema, full range of motion is present. Skin is warm and dry without rash. Neurologic: Mental status is normal, cranial nerves are intact, there are no motor or sensory deficits.  ED Course  Procedures (including critical care time) Labs Review Results for orders placed during the hospital encounter of 07/18/14  CBC WITH DIFFERENTIAL      Result Value Ref Range   WBC 14.9 (*) 4.0 - 10.5 K/uL   RBC 5.18 (*) 3.87 - 5.11 MIL/uL   Hemoglobin 14.9  12.0 - 15.0 g/dL   HCT 09.8  11.9 - 14.7 %   MCV 84.6  78.0 - 100.0 fL   MCH 28.8  26.0 - 34.0 pg   MCHC 34.0  30.0 - 36.0 g/dL   RDW 82.9  56.2 - 13.0 %   Platelets 299  150 - 400 K/uL   Neutrophils Relative % 75  43 - 77 %   Neutro Abs 11.1 (*) 1.7 - 7.7 K/uL   Lymphocytes Relative 19  12 - 46 %   Lymphs Abs 2.8  0.7 - 4.0 K/uL   Monocytes Relative 6  3 - 12 %   Monocytes Absolute 0.9  0.1 - 1.0 K/uL   Eosinophils Relative 0  0 - 5 %   Eosinophils Absolute 0.0  0.0 - 0.7 K/uL   Basophils Relative 0  0 - 1 %   Basophils Absolute 0.1  0.0 - 0.1 K/uL  COMPREHENSIVE METABOLIC PANEL      Result Value Ref Range   Sodium 137  137 - 147 mEq/L   Potassium 3.6 (*) 3.7 - 5.3 mEq/L   Chloride 98  96 - 112 mEq/L   CO2 23  19 - 32 mEq/L   Glucose, Bld 102 (*) 70 - 99 mg/dL   BUN 14  6 - 23 mg/dL   Creatinine, Ser 8.65  0.50 - 1.10 mg/dL   Calcium 9.7  8.4 - 78.4 mg/dL   Total Protein 8.2  6.0 - 8.3 g/dL   Albumin 4.0  3.5 - 5.2 g/dL   AST 15  0  - 37 U/L   ALT 11  0 - 35 U/L   Alkaline Phosphatase 143 (*) 39 - 117 U/L   Total Bilirubin 0.5  0.3 - 1.2 mg/dL   GFR calc  non Af Amer 85 (*) >90 mL/min   GFR calc Af Amer >90  >90 mL/min   Anion gap 16 (*) 5 - 15  I-STAT TROPOININ, ED      Result Value Ref Range   Troponin i, poc 0.00  0.00 - 0.08 ng/mL   Comment 3            MDM   Final diagnoses:  Nonintractable headache, unspecified chronicity pattern, unspecified headache type    Headache of uncertain cause. Probably multifactorial including dehydration, possible muscle contraction headache, and possible migraine. She will be given IV hydration as well as metoclopramide with diphenhydramine.  She feels much better after above treatment and is discharged.  Dione Booze, MD 07/18/14 [phone]

## 2014-07-18 NOTE — ED Notes (Addendum)
Presents with headache that gradually worsened throughout the day after working for 5 hours in the yard. "I was out working in the yard all day and got really hot, so I came in to cool down and I started throwing up, the headahce started light while I was working and then just got worse. Now it feels like my head is in a vice grip" alert, oriented and MAEx4. Sensitive to light and sound.  "I took a migraine medicine and a nitro because I was having chest pain, but I threw it all up" denies pain a tthis time. Pt has indigestion and nausea at this time.

## 2014-07-30 ENCOUNTER — Ambulatory Visit: Payer: Medicare PPO | Admitting: Diagnostic Neuroimaging

## 2014-08-13 ENCOUNTER — Other Ambulatory Visit (HOSPITAL_COMMUNITY): Payer: Self-pay | Admitting: Specialist

## 2014-08-13 DIAGNOSIS — M25551 Pain in right hip: Secondary | ICD-10-CM

## 2014-08-18 ENCOUNTER — Encounter (HOSPITAL_COMMUNITY)
Admission: RE | Admit: 2014-08-18 | Discharge: 2014-08-18 | Disposition: A | Discharge status: Home / Self Care | Payer: Medicare HMO | Source: Ambulatory Visit | Attending: Specialist | Admitting: Specialist

## 2014-08-18 DIAGNOSIS — M25559 Pain in unspecified hip: Secondary | ICD-10-CM | POA: Diagnosis present

## 2014-08-18 DIAGNOSIS — M25551 Pain in right hip: Secondary | ICD-10-CM

## 2014-08-18 MED ORDER — TECHNETIUM TC 99M MEDRONATE IV KIT
25.0000 | PACK | Freq: Once | INTRAVENOUS | Status: AC | PRN
  Administered 2014-08-18: 25 via INTRAVENOUS

## 2014-09-23 ENCOUNTER — Ambulatory Visit (INDEPENDENT_AMBULATORY_CARE_PROVIDER_SITE_OTHER): Payer: Medicare FFS | Admitting: Internal Medicine

## 2014-09-30 ENCOUNTER — Encounter (INDEPENDENT_AMBULATORY_CARE_PROVIDER_SITE_OTHER): Payer: Self-pay | Admitting: Internal Medicine

## 2014-09-30 ENCOUNTER — Encounter (INDEPENDENT_AMBULATORY_CARE_PROVIDER_SITE_OTHER): Payer: Self-pay | Admitting: *Deleted

## 2014-09-30 ENCOUNTER — Ambulatory Visit (INDEPENDENT_AMBULATORY_CARE_PROVIDER_SITE_OTHER): Payer: Medicare HMO | Admitting: Internal Medicine

## 2014-09-30 VITALS — BP 112/68 | HR 72 | Temp 98.1°F | Resp 18 | Ht 66.0 in | Wt 160.6 lb

## 2014-09-30 DIAGNOSIS — Z8619 Personal history of other infectious and parasitic diseases: Secondary | ICD-10-CM
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
## 2014-11-24 DIAGNOSIS — J40 Bronchitis, not specified as acute or chronic: Secondary | ICD-10-CM | POA: Diagnosis not present

## 2014-12-11 DIAGNOSIS — J40 Bronchitis, not specified as acute or chronic: Secondary | ICD-10-CM | POA: Diagnosis not present

## 2014-12-11 DIAGNOSIS — M549 Dorsalgia, unspecified: Secondary | ICD-10-CM | POA: Diagnosis not present

## 2014-12-17 DIAGNOSIS — M5441 Lumbago with sciatica, right side: Secondary | ICD-10-CM | POA: Diagnosis not present

## 2015-01-14 DIAGNOSIS — M5441 Lumbago with sciatica, right side: Secondary | ICD-10-CM | POA: Diagnosis not present

## 2015-01-14 DIAGNOSIS — M545 Low back pain: Secondary | ICD-10-CM | POA: Diagnosis not present

## 2015-01-27 DIAGNOSIS — F411 Generalized anxiety disorder: Secondary | ICD-10-CM | POA: Diagnosis not present

## 2015-01-27 DIAGNOSIS — E782 Mixed hyperlipidemia: Secondary | ICD-10-CM | POA: Diagnosis not present

## 2015-01-27 DIAGNOSIS — G43909 Migraine, unspecified, not intractable, without status migrainosus: Secondary | ICD-10-CM | POA: Diagnosis not present

## 2015-01-27 DIAGNOSIS — M797 Fibromyalgia: Secondary | ICD-10-CM | POA: Diagnosis not present

## 2015-01-27 DIAGNOSIS — Z6826 Body mass index (BMI) 26.0-26.9, adult: Secondary | ICD-10-CM | POA: Diagnosis not present

## 2015-01-27 DIAGNOSIS — K219 Gastro-esophageal reflux disease without esophagitis: Secondary | ICD-10-CM | POA: Diagnosis not present

## 2015-02-26 DIAGNOSIS — E782 Mixed hyperlipidemia: Secondary | ICD-10-CM | POA: Diagnosis not present

## 2015-02-26 DIAGNOSIS — Z6826 Body mass index (BMI) 26.0-26.9, adult: Secondary | ICD-10-CM | POA: Diagnosis not present

## 2015-02-26 DIAGNOSIS — M545 Low back pain: Secondary | ICD-10-CM | POA: Diagnosis not present

## 2015-02-26 DIAGNOSIS — R079 Chest pain, unspecified: Secondary | ICD-10-CM | POA: Diagnosis not present

## 2015-04-10 DIAGNOSIS — M545 Low back pain: Secondary | ICD-10-CM | POA: Diagnosis not present

## 2015-04-10 DIAGNOSIS — G4700 Insomnia, unspecified: Secondary | ICD-10-CM | POA: Diagnosis not present

## 2015-04-10 DIAGNOSIS — F411 Generalized anxiety disorder: Secondary | ICD-10-CM | POA: Diagnosis not present

## 2015-04-10 DIAGNOSIS — M797 Fibromyalgia: Secondary | ICD-10-CM | POA: Diagnosis not present

## 2015-05-06 DIAGNOSIS — E782 Mixed hyperlipidemia: Secondary | ICD-10-CM | POA: Diagnosis not present

## 2015-05-06 DIAGNOSIS — I10 Essential (primary) hypertension: Secondary | ICD-10-CM | POA: Diagnosis not present

## 2015-05-08 DIAGNOSIS — E782 Mixed hyperlipidemia: Secondary | ICD-10-CM | POA: Diagnosis not present

## 2015-05-08 DIAGNOSIS — K219 Gastro-esophageal reflux disease without esophagitis: Secondary | ICD-10-CM | POA: Diagnosis not present

## 2015-05-08 DIAGNOSIS — M545 Low back pain: Secondary | ICD-10-CM | POA: Diagnosis not present

## 2015-05-08 DIAGNOSIS — I2510 Atherosclerotic heart disease of native coronary artery without angina pectoris: Secondary | ICD-10-CM | POA: Diagnosis not present

## 2015-06-05 DIAGNOSIS — M545 Low back pain: Secondary | ICD-10-CM | POA: Diagnosis not present

## 2015-06-05 DIAGNOSIS — N8320 Unspecified ovarian cysts: Secondary | ICD-10-CM | POA: Diagnosis not present

## 2015-06-05 DIAGNOSIS — G35 Multiple sclerosis: Secondary | ICD-10-CM | POA: Diagnosis not present

## 2015-06-05 DIAGNOSIS — E039 Hypothyroidism, unspecified: Secondary | ICD-10-CM | POA: Diagnosis not present

## 2015-06-19 DIAGNOSIS — E039 Hypothyroidism, unspecified: Secondary | ICD-10-CM | POA: Diagnosis not present

## 2015-06-19 DIAGNOSIS — I2510 Atherosclerotic heart disease of native coronary artery without angina pectoris: Secondary | ICD-10-CM | POA: Diagnosis not present

## 2015-06-19 DIAGNOSIS — E782 Mixed hyperlipidemia: Secondary | ICD-10-CM | POA: Diagnosis not present

## 2015-06-22 ENCOUNTER — Ambulatory Visit (INDEPENDENT_AMBULATORY_CARE_PROVIDER_SITE_OTHER): Payer: Commercial Managed Care - HMO | Admitting: Cardiology

## 2015-06-22 ENCOUNTER — Encounter: Payer: Self-pay | Admitting: Cardiology

## 2015-06-22 VITALS — BP 108/76 | HR 82 | Ht 66.0 in | Wt 157.0 lb

## 2015-06-22 DIAGNOSIS — I10 Essential (primary) hypertension: Secondary | ICD-10-CM | POA: Diagnosis not present

## 2015-06-22 DIAGNOSIS — E782 Mixed hyperlipidemia: Secondary | ICD-10-CM | POA: Diagnosis not present

## 2015-06-22 DIAGNOSIS — K219 Gastro-esophageal reflux disease without esophagitis: Secondary | ICD-10-CM | POA: Diagnosis not present

## 2015-06-22 DIAGNOSIS — R072 Precordial pain: Secondary | ICD-10-CM | POA: Diagnosis not present

## 2015-06-22 DIAGNOSIS — R0789 Other chest pain: Secondary | ICD-10-CM

## 2015-06-22 DIAGNOSIS — E039 Hypothyroidism, unspecified: Secondary | ICD-10-CM | POA: Diagnosis not present

## 2015-06-22 NOTE — Progress Notes (Signed)
Patient ID: Shannon Chandler, female   DOB: 1966/07/17, 49 y.o.   MRN: [id number]     Clinical Summary Ms. Shannon Chandler is a 49 y.o.female seen today as a new patient for the following medical problems.  1. Chest pain - prior testing in [place] at [facility] in [place], [place] - apparently had some heart testing at that time, but unsure of what tests exactly  - reports recent chest pain. Left sided, pressure pain 7/10. Better with NG, has used 4 times over the last year. Pain often comes on with anxiety. +SOB. Feels nauseous, can have some palpitations. Worst with movement. Pain lasts for an hour or more, sometimes better with anxiety meds. Pain occurs 5 times over the last 6 months. - notes some DOE over the last few months. Denies any LE edema  CAD risk factors: HL, father MI mid 76s.    Past Medical History  Diagnosis Date  . PTSD (post-traumatic stress disorder)   . Depression   . Bipolar 1 disorder   . Fibromyalgia   . Memory loss   . Hepatitis C      Allergies  Allergen Reactions  . Gabapentin Hives  . Sulfa Antibiotics     Unknown  . Zoloft [Sertraline Hcl]     Unknown     Current Outpatient Prescriptions  Medication Sig Dispense Refill  . ACYCLOVIR PO Take 500 mg by mouth daily.    . budesonide-formoterol (SYMBICORT) 160-4.5 MCG/ACT inhaler Inhale 2 puffs into the lungs 2 (two) times daily.    . busPIRone (BUSPAR) 10 MG tablet Take 10 mg by mouth 2 (two) times daily before a meal.     . docusate sodium (COLACE) 100 MG capsule Take 100 mg by mouth 2 (two) times daily.    Marland Kitchen escitalopram (LEXAPRO) 10 MG tablet Take 10 mg by mouth. Patient states that she takes (2) 10 mg tablets by mouth in the morning.    Marland Kitchen HYDROcodone-acetaminophen (NORCO/VICODIN) 5-325 MG per tablet Take 1 tablet by mouth every 6 (six) hours as needed for moderate pain.    Marland Kitchen loratadine (CLARITIN) 10 MG tablet Take 10 mg by mouth daily.    . meloxicam (MOBIC) 15 MG tablet Take 15 mg by mouth  daily.    . methocarbamol (ROBAXIN) 500 MG tablet Take 1 tablet (500 mg total) by mouth 2 (two) times daily. 20 tablet 0  . mometasone (ASMANEX 120 METERED DOSES) 220 MCG/INH inhaler Inhale 2 puffs into the lungs daily.    . Multiple Vitamins-Minerals (WOMENS MULTI PO) Take by mouth daily.    . nitroGLYCERIN (NITROSTAT) 0.4 MG SL tablet Place 0.4 mg under the tongue every 5 (five) minutes as needed for chest pain.    . pantoprazole (PROTONIX) 40 MG tablet Take 40 mg by mouth daily.    . traMADol (ULTRAM) 50 MG tablet Take 50 mg by mouth every 6 (six) hours as needed for pain.     Marland Kitchen zolmitriptan (ZOMIG) 5 MG tablet Take 5 mg by mouth as needed for migraine.    Marland Kitchen zolpidem (AMBIEN) 10 MG tablet Take 10 mg by mouth at bedtime as needed for sleep.     No current facility-administered medications for this visit.     Past Surgical History  Procedure Laterality Date  . Appendectomy    . Tonsillecdtomy    . Double mastectomy for famiy hx      4 yrs ago     Allergies  Allergen Reactions  . Gabapentin  Hives  . Sulfa Antibiotics     Unknown  . Zoloft [Sertraline Hcl]     Unknown      No family history on file.   Social History Ms. Shannon Chandler reports that she has never smoked. She has never used smokeless tobacco. Ms. Shannon Chandler reports that she drinks alcohol.   Review of Systems CONSTITUTIONAL: No weight loss, fever, chills, weakness or fatigue.  HEENT: Eyes: No visual loss, blurred vision, double vision or yellow sclerae.No hearing loss, sneezing, congestion, runny nose or sore throat.  SKIN: No rash or itching.  CARDIOVASCULAR: per HPI RESPIRATORY: No shortness of breath, cough or sputum.  GASTROINTESTINAL: No anorexia, nausea, vomiting or diarrhea. No abdominal pain or blood.  GENITOURINARY: No burning on urination, no polyuria NEUROLOGICAL: No headache, dizziness, syncope, paralysis, ataxia, numbness or tingling in the extremities. No change in bowel or bladder control.    MUSCULOSKELETAL: back pain  LYMPHATICS: No enlarged nodes. No history of splenectomy.  PSYCHIATRIC: No history of depression or anxiety.  ENDOCRINOLOGIC: No reports of sweating, cold or heat intolerance. No polyuria or polydipsia.  Marland Kitchen   Physical Examination Filed Vitals:   06/22/15 0923  BP: 108/76  Pulse: 82   Filed Vitals:   06/22/15 0923  Height:  (1.676 m)  Weight: 157 lb (71.215 kg)    Gen: resting comfortably, no acute distress HEENT: no scleral icterus, pupils equal round and reactive, no palptable cervical adenopathy,  CV: RRR, no m/r/g, no JVD Resp: Clear to auscultation bilaterally GI: abdomen is soft, non-tender, non-distended, normal bowel sounds, no hepatosplenomegaly MSK: extremities are warm, no edema.  Skin: warm, no rash Neuro:  no focal deficits Psych: appropriate affect   Diagnostic Studies Clinic EKG: NSR    Assessment and Plan   1. Chest pain - unclear etiology, risk factors include family history and HL - will obtain stress testing. Due to her chronic lower back pain she states she is unable to run on a treadmill, will obtain a Lexiscan.     F/u pending stress test results  Antoine Poche, M.D.

## 2015-06-22 NOTE — Patient Instructions (Signed)
Your physician recommends that you schedule a follow-up appointment in: to be determined after test    Your physician has requested that you have a lexiscan myoview. For further information please visit [email or web address]. Please follow instruction sheet, as given.     Your physician recommends that you continue on your current medications as directed. Please refer to the Current Medication list given to you today.     Thank you for choosing [facility] !         

## 2015-07-06 ENCOUNTER — Encounter (HOSPITAL_COMMUNITY): Payer: Commercial Managed Care - HMO

## 2015-07-06 ENCOUNTER — Inpatient Hospital Stay (HOSPITAL_COMMUNITY): Admission: RE | Admit: 2015-07-06 | Payer: Medicare HMO | Source: Ambulatory Visit

## 2015-07-10 ENCOUNTER — Encounter: Payer: Self-pay | Admitting: Obstetrics & Gynecology

## 2015-07-10 DIAGNOSIS — M545 Low back pain: Secondary | ICD-10-CM | POA: Diagnosis not present

## 2015-07-29 ENCOUNTER — Encounter (HOSPITAL_COMMUNITY): Payer: Commercial Managed Care - HMO

## 2015-07-29 ENCOUNTER — Ambulatory Visit (HOSPITAL_COMMUNITY): Payer: Commercial Managed Care - HMO

## 2015-07-29 ENCOUNTER — Inpatient Hospital Stay (HOSPITAL_COMMUNITY): Admission: RE | Admit: 2015-07-29 | Payer: Medicare HMO | Source: Ambulatory Visit

## 2015-07-30 ENCOUNTER — Encounter: Payer: Self-pay | Admitting: Obstetrics & Gynecology

## 2015-08-07 DIAGNOSIS — F411 Generalized anxiety disorder: Secondary | ICD-10-CM | POA: Diagnosis not present

## 2015-08-07 DIAGNOSIS — G894 Chronic pain syndrome: Secondary | ICD-10-CM | POA: Diagnosis not present

## 2015-10-07 DIAGNOSIS — M545 Low back pain: Secondary | ICD-10-CM | POA: Diagnosis not present

## 2015-10-07 DIAGNOSIS — J019 Acute sinusitis, unspecified: Secondary | ICD-10-CM | POA: Diagnosis not present

## 2015-11-06 DIAGNOSIS — J019 Acute sinusitis, unspecified: Secondary | ICD-10-CM | POA: Diagnosis not present

## 2015-11-06 DIAGNOSIS — G894 Chronic pain syndrome: Secondary | ICD-10-CM | POA: Diagnosis not present

## 2015-11-06 DIAGNOSIS — J00 Acute nasopharyngitis [common cold]: Secondary | ICD-10-CM | POA: Diagnosis not present

## 2015-11-06 DIAGNOSIS — G43001 Migraine without aura, not intractable, with status migrainosus: Secondary | ICD-10-CM | POA: Diagnosis not present

## 2015-12-25 DIAGNOSIS — E039 Hypothyroidism, unspecified: Secondary | ICD-10-CM | POA: Diagnosis not present

## 2015-12-25 DIAGNOSIS — E782 Mixed hyperlipidemia: Secondary | ICD-10-CM | POA: Diagnosis not present

## 2015-12-30 DIAGNOSIS — I10 Essential (primary) hypertension: Secondary | ICD-10-CM | POA: Diagnosis not present

## 2015-12-30 DIAGNOSIS — E039 Hypothyroidism, unspecified: Secondary | ICD-10-CM | POA: Diagnosis not present

## 2015-12-30 DIAGNOSIS — F411 Generalized anxiety disorder: Secondary | ICD-10-CM | POA: Diagnosis not present

## 2015-12-30 DIAGNOSIS — G43001 Migraine without aura, not intractable, with status migrainosus: Secondary | ICD-10-CM | POA: Diagnosis not present

## 2015-12-30 DIAGNOSIS — J329 Chronic sinusitis, unspecified: Secondary | ICD-10-CM | POA: Diagnosis not present

## 2015-12-30 DIAGNOSIS — R070 Pain in throat: Secondary | ICD-10-CM | POA: Diagnosis not present

## 2015-12-30 DIAGNOSIS — E782 Mixed hyperlipidemia: Secondary | ICD-10-CM | POA: Diagnosis not present

## 2015-12-30 DIAGNOSIS — G894 Chronic pain syndrome: Secondary | ICD-10-CM | POA: Diagnosis not present

## 2016-01-13 DIAGNOSIS — R05 Cough: Secondary | ICD-10-CM | POA: Diagnosis not present

## 2016-01-13 DIAGNOSIS — J020 Streptococcal pharyngitis: Secondary | ICD-10-CM | POA: Diagnosis not present

## 2016-04-08 DIAGNOSIS — F411 Generalized anxiety disorder: Secondary | ICD-10-CM | POA: Diagnosis not present

## 2016-04-08 DIAGNOSIS — G894 Chronic pain syndrome: Secondary | ICD-10-CM | POA: Diagnosis not present

## 2016-06-10 DIAGNOSIS — E039 Hypothyroidism, unspecified: Secondary | ICD-10-CM | POA: Diagnosis not present

## 2016-06-10 DIAGNOSIS — J069 Acute upper respiratory infection, unspecified: Secondary | ICD-10-CM | POA: Diagnosis not present

## 2016-06-10 DIAGNOSIS — D512 Transcobalamin II deficiency: Secondary | ICD-10-CM | POA: Diagnosis not present

## 2016-06-10 DIAGNOSIS — E782 Mixed hyperlipidemia: Secondary | ICD-10-CM | POA: Diagnosis not present

## 2016-07-13 DIAGNOSIS — G894 Chronic pain syndrome: Secondary | ICD-10-CM | POA: Diagnosis not present

## 2016-07-13 DIAGNOSIS — N952 Postmenopausal atrophic vaginitis: Secondary | ICD-10-CM | POA: Diagnosis not present

## 2016-07-13 DIAGNOSIS — G43001 Migraine without aura, not intractable, with status migrainosus: Secondary | ICD-10-CM | POA: Diagnosis not present

## 2016-07-13 DIAGNOSIS — F411 Generalized anxiety disorder: Secondary | ICD-10-CM | POA: Diagnosis not present

## 2016-07-13 DIAGNOSIS — E039 Hypothyroidism, unspecified: Secondary | ICD-10-CM | POA: Diagnosis not present

## 2016-07-13 DIAGNOSIS — E782 Mixed hyperlipidemia: Secondary | ICD-10-CM | POA: Diagnosis not present

## 2016-07-13 DIAGNOSIS — I10 Essential (primary) hypertension: Secondary | ICD-10-CM | POA: Diagnosis not present

## 2016-07-13 DIAGNOSIS — F4310 Post-traumatic stress disorder, unspecified: Secondary | ICD-10-CM | POA: Diagnosis not present

## 2016-08-24 DIAGNOSIS — J029 Acute pharyngitis, unspecified: Secondary | ICD-10-CM | POA: Diagnosis not present

## 2016-08-24 DIAGNOSIS — R112 Nausea with vomiting, unspecified: Secondary | ICD-10-CM | POA: Diagnosis not present

## 2016-08-24 DIAGNOSIS — E039 Hypothyroidism, unspecified: Secondary | ICD-10-CM | POA: Diagnosis not present

## 2016-08-24 DIAGNOSIS — R51 Headache: Secondary | ICD-10-CM | POA: Diagnosis not present

## 2016-08-24 DIAGNOSIS — Z6826 Body mass index (BMI) 26.0-26.9, adult: Secondary | ICD-10-CM | POA: Diagnosis not present

## 2017-01-09 DIAGNOSIS — I10 Essential (primary) hypertension: Secondary | ICD-10-CM | POA: Diagnosis not present

## 2017-01-09 DIAGNOSIS — E039 Hypothyroidism, unspecified: Secondary | ICD-10-CM | POA: Diagnosis not present

## 2017-02-23 DIAGNOSIS — N644 Mastodynia: Secondary | ICD-10-CM | POA: Diagnosis not present

## 2017-02-23 DIAGNOSIS — N6311 Unspecified lump in the right breast, upper outer quadrant: Secondary | ICD-10-CM | POA: Diagnosis not present

## 2017-03-06 DIAGNOSIS — E782 Mixed hyperlipidemia: Secondary | ICD-10-CM | POA: Diagnosis not present

## 2017-03-06 DIAGNOSIS — I10 Essential (primary) hypertension: Secondary | ICD-10-CM | POA: Diagnosis not present

## 2017-03-06 DIAGNOSIS — E039 Hypothyroidism, unspecified: Secondary | ICD-10-CM | POA: Diagnosis not present

## 2017-03-09 DIAGNOSIS — F4310 Post-traumatic stress disorder, unspecified: Secondary | ICD-10-CM | POA: Diagnosis not present

## 2017-03-09 DIAGNOSIS — M797 Fibromyalgia: Secondary | ICD-10-CM | POA: Diagnosis not present

## 2017-03-09 DIAGNOSIS — I10 Essential (primary) hypertension: Secondary | ICD-10-CM | POA: Diagnosis not present

## 2017-03-09 DIAGNOSIS — E782 Mixed hyperlipidemia: Secondary | ICD-10-CM | POA: Diagnosis not present

## 2017-03-09 DIAGNOSIS — F411 Generalized anxiety disorder: Secondary | ICD-10-CM | POA: Diagnosis not present

## 2017-03-09 DIAGNOSIS — E039 Hypothyroidism, unspecified: Secondary | ICD-10-CM | POA: Diagnosis not present

## 2017-03-09 DIAGNOSIS — Z6824 Body mass index (BMI) 24.0-24.9, adult: Secondary | ICD-10-CM | POA: Diagnosis not present

## 2017-03-09 DIAGNOSIS — G894 Chronic pain syndrome: Secondary | ICD-10-CM | POA: Diagnosis not present

## 2017-03-09 DIAGNOSIS — G43001 Migraine without aura, not intractable, with status migrainosus: Secondary | ICD-10-CM | POA: Diagnosis not present

## 2017-06-15 ENCOUNTER — Other Ambulatory Visit: Payer: Self-pay | Admitting: Pharmacy Technician

## 2017-06-15 NOTE — Patient Outreach (Signed)
[facility]) Care Management  06/15/2017  FAMA TULLY [birth year] [id number]   [facility] for medication adherence and to verify last refill for Simvastatin 20mg . I spoke to [place] whom verified last script filled was 05/18 for a 30 day supply. Ladona Ridgel also informed me that the patient receives her maintenance meds thru the [place] and only fills drugs locally when she is unable to get to the [place].  Suzan Slick Ernesta Amble [facility] Care Management [phone]

## 2017-07-11 ENCOUNTER — Emergency Department
Admission: EM | Admit: 2017-07-11 | Discharge: 2017-07-11 | Disposition: A | Discharge status: Home / Self Care | Payer: Medicare HMO | Attending: Student in an Organized Health Care Education/Training Program | Admitting: Student in an Organized Health Care Education/Training Program

## 2017-07-11 ENCOUNTER — Encounter: Payer: Self-pay | Admitting: *Deleted

## 2017-07-11 ENCOUNTER — Emergency Department: Payer: Medicare HMO

## 2017-07-11 DIAGNOSIS — Z79899 Other long term (current) drug therapy: Secondary | ICD-10-CM | POA: Diagnosis not present

## 2017-07-11 DIAGNOSIS — M25571 Pain in right ankle and joints of right foot: Secondary | ICD-10-CM | POA: Diagnosis not present

## 2017-07-11 DIAGNOSIS — M25551 Pain in right hip: Secondary | ICD-10-CM | POA: Insufficient documentation

## 2017-07-11 DIAGNOSIS — Z9181 History of falling: Secondary | ICD-10-CM | POA: Insufficient documentation

## 2017-07-11 DIAGNOSIS — M25561 Pain in right knee: Secondary | ICD-10-CM | POA: Diagnosis not present

## 2017-07-11 DIAGNOSIS — S79911A Unspecified injury of right hip, initial encounter: Secondary | ICD-10-CM | POA: Diagnosis not present

## 2017-07-11 DIAGNOSIS — W19XXXA Unspecified fall, initial encounter: Secondary | ICD-10-CM

## 2017-07-11 MED ORDER — ORPHENADRINE CITRATE 30 MG/ML IJ SOLN
60.0000 mg | Freq: Two times a day (BID) | INTRAMUSCULAR | Status: DC
  Administered 2017-07-11: 60 mg via INTRAMUSCULAR
  Filled 2017-07-11: qty 2

## 2017-07-11 MED ORDER — CYCLOBENZAPRINE HCL 5 MG PO TABS: 5.0000 mg | ORAL_TABLET | Freq: Three times a day (TID) | ORAL | 0 refills | Status: AC | PRN

## 2017-07-11 MED ORDER — MELOXICAM 15 MG PO TABS: 15.0000 mg | ORAL_TABLET | Freq: Every day | ORAL | 1 refills | Status: AC

## 2017-07-11 MED ORDER — KETOROLAC TROMETHAMINE 30 MG/ML IJ SOLN
30.0000 mg | Freq: Once | INTRAMUSCULAR | Status: AC
  Administered 2017-07-11: 30 mg via INTRAMUSCULAR
  Filled 2017-07-11: qty 1

## 2017-07-11 NOTE — ED Provider Notes (Signed)
[facility] Emergency Department Provider Note  ____________________________________________  Time seen: Approximately 5:41 PM  I have reviewed the triage vital signs and the nursing notes.   HISTORY  Chief Complaint Ankle Pain and Knee Injury    HPI Shannon Chandler is a 51 y.o. female presenting to the emergency department after patient tripped and fell while walking her dog on 07/07/2017. Patient has noticed bruising along the right ankle. Patient has been ambulating without difficulty. She reports 10 out of 10 right ankle, right knee and right hip pain. She denies back pain, neck pain, weakness, radiculopathy or changes in sensation of the upper or lower extremities. Patient denies hitting her head or loss of consciousness. Patient has been taking ibuprofen but has attempted no other alleviating measures.   Past Medical History:  Diagnosis Date  . Bipolar 1 disorder (HCC)   . Depression   . Fibromyalgia   . Hepatitis C   . Memory loss   . PTSD (post-traumatic stress disorder)     Patient Active Problem List   Diagnosis Date Noted  . Hepatitis C 10/30/2012  . Depression 10/30/2012  . Fibromyalgia 10/30/2012  . Bipolar 1 disorder (HCC) 10/30/2012  . PTSD (post-traumatic stress disorder) 10/30/2012  . Genital herpes 10/30/2012    Past Surgical History:  Procedure Laterality Date  . ABDOMINAL HYSTERECTOMY    . APPENDECTOMY    . Double mastectomy for famiy hx     4 yrs ago  . tonsillecdtomy      Prior to Admission medications   Medication Sig Start Date End Date Taking? Authorizing Provider  ACYCLOVIR PO Take 500 mg by mouth daily.    [provider]  baclofen (LIORESAL) 10 MG tablet  05/28/15   [provider]  budesonide-formoterol (SYMBICORT) 160-4.5 MCG/ACT inhaler Inhale 2 puffs into the lungs 2 (two) times daily.    [provider]  clonazePAM (KLONOPIN) 0.5 MG tablet  05/08/15   [provider]   escitalopram (LEXAPRO) 10 MG tablet Take 10 mg by mouth. Patient states that she takes (2) 10 mg tablets by mouth in the morning.    [provider]  HYDROcodone-acetaminophen (NORCO/VICODIN) 5-325 MG per tablet Take 1 tablet by mouth every 6 (six) hours as needed for moderate pain.    [provider]  levothyroxine (SYNTHROID, LEVOTHROID) 75 MCG tablet Take 75 mcg by mouth daily before breakfast.    [provider]  loratadine (CLARITIN) 10 MG tablet Take 10 mg by mouth daily.    [provider]  mometasone (ASMANEX 120 METERED DOSES) 220 MCG/INH inhaler Inhale 2 puffs into the lungs daily.    [provider]  nitroGLYCERIN (NITROSTAT) 0.4 MG SL tablet Place 0.4 mg under the tongue every 5 (five) minutes as needed for chest pain.    [provider]  norethindrone (AYGESTIN) 5 MG tablet Take 5 mg by mouth daily.    [provider]  pantoprazole (PROTONIX) 40 MG tablet Take 40 mg by mouth daily.    [provider]  simvastatin (ZOCOR) 20 MG tablet  06/03/15   [provider]  zolmitriptan (ZOMIG) 5 MG tablet Take 5 mg by mouth as needed for migraine.    [provider]  zolpidem (AMBIEN) 10 MG tablet Take 10 mg by mouth at bedtime as needed for sleep.    [provider]    Allergies Gabapentin; Sulfa antibiotics; and Zoloft [sertraline hcl]  History reviewed. No pertinent family history.  Social  History Social History  Substance Use Topics  . Smoking status: Never Smoker  . Smokeless tobacco: Never Used  . Alcohol use 0.0 oz/week     Comment: Occasionally etoh, every couple of month     Review of Systems  Constitutional: No fever/chills Eyes: No visual changes. No discharge Cardiovascular: no chest pain. Respiratory: no cough. No SOB. Gastrointestinal: No abdominal pain.  No nausea, no vomiting.  No diarrhea.  No constipation. Musculoskeletal: patient has right knee and right hip  pain. Skin: patient has ecchymosis of right ankle. Neurological: Negative for headaches, focal weakness or numbness.   ____________________________________________   PHYSICAL EXAM:  VITAL SIGNS: ED Triage Vitals [07/11/17 1609]  Enc Vitals Group     BP      Pulse      Resp      Temp      Temp src      SpO2      Weight 140 lb (63.5 kg)     Height 5\' 6"  (1.676 m)     Head Circumference      Peak Flow      Pain Score 8     Pain Loc      Pain Edu?      Excl. in GC?      Constitutional: Alert and oriented. Well appearing and in no acute distress. Eyes: Conjunctivae are normal. PERRL. EOMI. Head: Atraumatic. Cardiovascular: Normal rate, regular rhythm. Normal S1 and S2.  Good peripheral circulation. Respiratory: Normal respiratory effort without tachypnea or retractions. Lungs CTAB. Good air entry to the bases with no decreased or absent breath sounds. Musculoskeletal: Full range of motion to all extremities. No gross deformities appreciated. Right knee: Negative anterior and posterior drawer test. No laxity with MCL or LCL testing. Negative apprehension test. Negative ballottement. Palpable dorsalis pedis pulse, right Neurologic:  Normal speech and language. No gross focal neurologic deficits are appreciated.  Skin:  Skin is warm, dry and intact. No rash noted. Psychiatric: Mood and affect are normal. Speech and behavior are normal. Patient exhibits appropriate insight and judgement.   ____________________________________________   LABS (all labs ordered are listed, but only abnormal results are displayed)  Labs Reviewed - No data to display ____________________________________________  EKG   ____________________________________________  RADIOLOGY Geraldo Pitter, personally viewed and evaluated these images (plain radiographs) as part of my medical decision making, as well as reviewing the written report by the radiologist.  Dg Ankle Complete Right  Result  Date: 07/11/2017 CLINICAL DATA:  Right ankle pain after fall. EXAM: RIGHT ANKLE - COMPLETE 3+ VIEW COMPARISON:  None. FINDINGS: There is no evidence of fracture, dislocation, or joint effusion. There is no evidence of arthropathy or other focal bone abnormality. Soft tissues are unremarkable. IMPRESSION: Normal right ankle. Electronically Signed   By: Lupita Raider, M.D.   On: 07/11/2017 17:14   Dg Knee Complete 4 Views Right  Result Date: 07/11/2017 CLINICAL DATA:  Right knee pain after fall. EXAM: RIGHT KNEE - COMPLETE 4+ VIEW COMPARISON:  None. FINDINGS: No evidence of fracture, dislocation, or joint effusion. No evidence of arthropathy or other focal bone abnormality. Soft tissues are unremarkable. IMPRESSION: Normal right knee. Electronically Signed   By: Lupita Raider, M.D.   On: 07/11/2017 17:15   Dg Hip Unilat With Pelvis 2-3 Views Right  Result Date: 07/11/2017 CLINICAL DATA:  Tripped over dog and fell several days ago with hip pain EXAM: DG HIP (WITH OR WITHOUT PELVIS) 2-3V RIGHT  COMPARISON:  None. FINDINGS: There is only slight loss of hip joint spaces bilaterally. No acute fracture is seen. The pelvic rami are intact. The SI joints are corticated. IMPRESSION: No acute fracture. Electronically Signed   By: Dwyane Dee M.D.   On: 07/11/2017 17:09    ____________________________________________    PROCEDURES  Procedure(s) performed:    Procedures    Medications  orphenadrine (NORFLEX) injection 60 mg (not administered)  ketorolac (TORADOL) 30 MG/ML injection 30 mg (not administered)     ____________________________________________   INITIAL IMPRESSION / ASSESSMENT AND PLAN / ED COURSE  Pertinent labs & imaging results that were available during my care of the patient were reviewed by me and considered in my medical decision making (see chart for details).  Review of the [place] CSRS was performed in accordance of the [facility] prior to dispensing any controlled drugs.      Assessment and plan Fall  Patient presents to the emergency department after a fall that occurred on 07/07/2017. X-ray examination conducted in the emergency department was unremarkable for acute fractures or bony abnormalities. Overall physical exam was reassuring. An Ace wrap was applied to right ankle in the emergency department. Ice was encouraged. Flexeril and Mobic were prescribed at discharge. Toradol and Norflex were given in the emergency department. All patient questions were answered.    ____________________________________________  FINAL CLINICAL IMPRESSION(S) / ED DIAGNOSES  Final diagnoses:  Fall      NEW MEDICATIONS STARTED DURING THIS VISIT:  New Prescriptions   No medications on file        This chart was dictated using voice recognition software/Dragon. Despite best efforts to proofread, errors can occur which can change the meaning. Any change was purely unintentional.    Orvil Feil, PA-C 07/11/17 1750    Willy Eddy, MD 07/11/17 2201

## 2017-07-11 NOTE — ED Triage Notes (Signed)
Pt reports having tripped over her  Dog on Friday and had right knee and ankle pain that radiates into right hip since the fall. No head trauma or LOC reported.

## 2017-11-18 ENCOUNTER — Other Ambulatory Visit: Payer: Self-pay

## 2017-11-18 ENCOUNTER — Emergency Department
Admission: EM | Admit: 2017-11-18 | Discharge: 2017-11-18 | Disposition: A | Discharge status: Home / Self Care | Payer: Medicare HMO | Attending: Emergency Medicine | Admitting: Emergency Medicine

## 2017-11-18 ENCOUNTER — Encounter: Payer: Self-pay | Admitting: *Deleted

## 2017-11-18 DIAGNOSIS — K602 Anal fissure, unspecified: Secondary | ICD-10-CM | POA: Diagnosis not present

## 2017-11-18 DIAGNOSIS — Z79899 Other long term (current) drug therapy: Secondary | ICD-10-CM | POA: Insufficient documentation

## 2017-11-18 DIAGNOSIS — K649 Unspecified hemorrhoids: Secondary | ICD-10-CM | POA: Diagnosis present

## 2017-11-18 MED ORDER — NITROGLYCERIN 0.4 % RE OINT: 1.0000 [in_us] | TOPICAL_OINTMENT | Freq: Two times a day (BID) | RECTAL | 1 refills | Status: AC

## 2017-11-18 MED ORDER — LIDOCAINE 5 % EX OINT: 1.0000 "application " | TOPICAL_OINTMENT | CUTANEOUS | 0 refills | Status: AC | PRN

## 2017-11-18 NOTE — ED Provider Notes (Signed)
[facility] Emergency Department Provider Note  ____________________________________________  Time seen: Approximately 3:26 PM  I have reviewed the triage vital signs and the nursing notes.   HISTORY  Chief Complaint Hemorrhoids   HPI Shannon Chandler is a 51 y.o. female who presents to the emergency department for evaluation and treatment of bright red blood in the stool for the past 2 days.  She reports having been constipated and felt a "tearing" sensation in the rectal area 2 days ago and noticed some blood afterward.  Patient states that she has had a bowel movement today, but states that it is excessively painful and she noticed blood dripping afterward.  She has attempted to use Preparation H without relief.  Past Medical History:  Diagnosis Date  . Bipolar 1 disorder (HCC)   . Depression   . Fibromyalgia   . Hepatitis C   . Memory loss   . PTSD (post-traumatic stress disorder)     Patient Active Problem List   Diagnosis Date Noted  . Hepatitis C 10/30/2012  . Depression 10/30/2012  . Fibromyalgia 10/30/2012  . Bipolar 1 disorder (HCC) 10/30/2012  . PTSD (post-traumatic stress disorder) 10/30/2012  . Genital herpes 10/30/2012    Past Surgical History:  Procedure Laterality Date  . ABDOMINAL HYSTERECTOMY    . APPENDECTOMY    . Double mastectomy for famiy hx     4 yrs ago  . tonsillecdtomy      Prior to Admission medications   Medication Sig Start Date End Date Taking? Authorizing Provider  ACYCLOVIR PO Take 500 mg by mouth daily.    [provider]  baclofen (LIORESAL) 10 MG tablet  05/28/15   [provider]  budesonide-formoterol (SYMBICORT) 160-4.5 MCG/ACT inhaler Inhale 2 puffs into the lungs 2 (two) times daily.    [provider]  clonazePAM (KLONOPIN) 0.5 MG tablet  05/08/15   [provider]  escitalopram (LEXAPRO) 10 MG tablet Take 10 mg by mouth. Patient states that she takes (2) 10 mg tablets  by mouth in the morning.    [provider]  HYDROcodone-acetaminophen (NORCO/VICODIN) 5-325 MG per tablet Take 1 tablet by mouth every 6 (six) hours as needed for moderate pain.    [provider]  levothyroxine (SYNTHROID, LEVOTHROID) 75 MCG tablet Take 75 mcg by mouth daily before breakfast.    [provider]  lidocaine (XYLOCAINE) 5 % ointment Apply 1 application topically as needed. 11/18/17   Tiersa Dayley, Rulon Eisenmenger B, FNP  loratadine (CLARITIN) 10 MG tablet Take 10 mg by mouth daily.    [provider]  mometasone (ASMANEX 120 METERED DOSES) 220 MCG/INH inhaler Inhale 2 puffs into the lungs daily.    [provider]  nitroGLYCERIN (NITROSTAT) 0.4 MG SL tablet Place 0.4 mg under the tongue every 5 (five) minutes as needed for chest pain.    [provider]  Nitroglycerin 0.4 % OINT Place 1 inch rectally 2 (two) times daily for 14 days. 11/18/17 12/02/17  Torianne Laflam, Rulon Eisenmenger B, FNP  norethindrone (AYGESTIN) 5 MG tablet Take 5 mg by mouth daily.    [provider]  pantoprazole (PROTONIX) 40 MG tablet Take 40 mg by mouth daily.    [provider]  simvastatin (ZOCOR) 20 MG tablet  06/03/15   [provider]  zolmitriptan (ZOMIG) 5 MG tablet Take 5 mg by mouth as needed for migraine.    [provider]  zolpidem (AMBIEN) 10 MG tablet Take 10 mg by mouth  at bedtime as needed for sleep.    [provider]    Allergies Gabapentin; Sulfa antibiotics; and Zoloft [sertraline hcl]  History reviewed. No pertinent family history.  Social History Social History   Tobacco Use  . Smoking status: Never Smoker  . Smokeless tobacco: Never Used  Substance Use Topics  . Alcohol use: Yes    Alcohol/week: 0.0 oz    Comment: Occasionally etoh, every couple of month  . Drug use: No    Review of Systems  Constitutional: Negative for fever. Respiratory: Negative for cough or shortness of breath.  Musculoskeletal:  Negative for myalgias Skin: Positive for hemorrhoids and tearing at the rectum Neurological: Negative for numbness or paresthesias. ____________________________________________   PHYSICAL EXAM:  VITAL SIGNS: ED Triage Vitals [11/18/17 1415]  Enc Vitals Group     BP (!) 116/5     Pulse Rate 71     Resp 16     Temp 98 F (36.7 C)     Temp Source Oral     SpO2 96 %     Weight 130 lb (59 kg)     Height 5\' 6"  (1.676 m)     Head Circumference      Peak Flow      Pain Score 8     Pain Loc      Pain Edu?      Excl. in GC?      Constitutional: Well appearing. Eyes: Conjunctivae are clear without discharge or drainage. Nose: No rhinorrhea noted. Mouth/Throat: Airway is patent.  Neck: No stridor. Unrestricted range of motion observed.  Cardiovascular: Capillary refill is <3 seconds.  Respiratory: Respirations are even and unlabored.. Musculoskeletal: Unrestricted range of motion observed. Neurologic: Awake, alert, and oriented x 4.  Skin: 3 mm anal fissure is noted at approximately the 8 o'clock position.  No active bleeding at time of exam.  No external hemorrhoids.  ____________________________________________   LABS (all labs ordered are listed, but only abnormal results are displayed)  Labs Reviewed - No data to display ____________________________________________  EKG  Not indicated ____________________________________________  RADIOLOGY  Not indicated ____________________________________________   PROCEDURES  Procedures ____________________________________________   INITIAL IMPRESSION / ASSESSMENT AND PLAN / ED COURSE  Shannon Chandler is a 51 y.o. female who presents to the emergency department for evaluation of exam and symptoms most consistent with a very small anal fissure.  She was advised to take MiraLAX 2 times per day until the stool become soft but was advised to stop if she develops diarrhea.  She was also encouraged to do sitz baths and use the  nitro ointment and lidocaine ointment as prescribed.  She was encouraged to follow-up with her gastroenterologist who she states is at the [place] or her primary care provider if symptoms continue for more than 2 weeks.  She was instructed to return to the emergency department for symptoms of change or worsen if she is unable to schedule an appointment.  Medications - No data to display   Pertinent labs & imaging results that were available during my care of the patient were reviewed by me and considered in my medical decision making (see chart for details). ____________________________________________   FINAL CLINICAL IMPRESSION(S) / ED DIAGNOSES  Final diagnoses:  Anal fissure    ED Discharge Orders        Ordered    Nitroglycerin 0.4 % OINT  2 times daily     11/18/17 1548    lidocaine (XYLOCAINE) 5 % ointment  As needed     11/18/17 1548       Note:  This document was prepared using Dragon voice recognition software and may include unintentional dictation errors.    Chinita Pesterriplett, Carra Brindley B, FNP 11/18/17 1554    Jeanmarie PlantMcShane, James A, MD 11/18/17 [phone]

## 2017-11-18 NOTE — Discharge Instructions (Signed)
Take Shannon Chandler 2 times per day until stool is soft. If you start to have diarrhea, decrease to once per day.  See your PCP or Gastroenterologist for symptoms that are not improving over the next 2 weeks.

## 2017-11-18 NOTE — ED Triage Notes (Signed)
Pt to ED reporting noting bright red blood in stool for the past two days. Pt reports she has eben having trouble having BM and felt "tearing" two days ago and noted blood after. Pt reports since then the bleeding has increased but pt reports she has continued to struggle having BMs. No tarry stool reported.

## 2017-12-29 ENCOUNTER — Other Ambulatory Visit: Payer: Self-pay

## 2017-12-29 ENCOUNTER — Emergency Department
Admission: EM | Admit: 2017-12-29 | Discharge: 2017-12-29 | Disposition: A | Discharge status: Home / Self Care | Payer: Medicare HMO | Attending: Emergency Medicine | Admitting: Emergency Medicine

## 2017-12-29 ENCOUNTER — Encounter: Payer: Self-pay | Admitting: Emergency Medicine

## 2017-12-29 DIAGNOSIS — G43009 Migraine without aura, not intractable, without status migrainosus: Secondary | ICD-10-CM | POA: Diagnosis not present

## 2017-12-29 DIAGNOSIS — Z79899 Other long term (current) drug therapy: Secondary | ICD-10-CM | POA: Insufficient documentation

## 2017-12-29 DIAGNOSIS — R51 Headache: Secondary | ICD-10-CM | POA: Diagnosis present

## 2017-12-29 MED ORDER — SODIUM CHLORIDE 0.9 % IV BOLUS (SEPSIS)
1000.0000 mL | Freq: Once | INTRAVENOUS | Status: AC
  Administered 2017-12-29: 1000 mL via INTRAVENOUS

## 2017-12-29 MED ORDER — DIPHENHYDRAMINE HCL 50 MG/ML IJ SOLN
25.0000 mg | Freq: Once | INTRAMUSCULAR | Status: AC
  Administered 2017-12-29: 25 mg via INTRAVENOUS
  Filled 2017-12-29: qty 1

## 2017-12-29 MED ORDER — KETOROLAC TROMETHAMINE 30 MG/ML IJ SOLN
15.0000 mg | Freq: Once | INTRAMUSCULAR | Status: AC
  Administered 2017-12-29: 15 mg via INTRAVENOUS
  Filled 2017-12-29: qty 1

## 2017-12-29 MED ORDER — BUTALBITAL-APAP-CAFFEINE 50-325-40 MG PO TABS: 1.0000 | ORAL_TABLET | Freq: Four times a day (QID) | ORAL | 0 refills | Status: AC | PRN

## 2017-12-29 MED ORDER — PROCHLORPERAZINE EDISYLATE 5 MG/ML IJ SOLN
10.0000 mg | Freq: Once | INTRAMUSCULAR | Status: AC
  Administered 2017-12-29: 10 mg via INTRAVENOUS
  Filled 2017-12-29: qty 2

## 2017-12-29 NOTE — ED Provider Notes (Signed)
[facility] Emergency Department Provider Note  ____________________________________________   First MD Initiated Contact with Patient 12/29/17 [phone]     (approximate)  I have reviewed the triage vital signs and the nursing notes.   HISTORY  Chief Complaint Migraine   HPI Shannon Chandler is a 52 y.o. female who self presents to the emergency department with roughly 3 days of gradual onset not maximal onset moderate to severe right-sided throbbing headache identical to previous migraine headaches.  She is followed at the [facility] and normally takes a triptan as needed when she gets migraines however it is not been working.  Every 3 months she gets Botox injections which normally help prevent these headaches.  This morning the headache prevented her from sleep which prompted the visit.  She does report some nausea no vomiting.  She does report light sensitivity.  No neck pain.  No numbness or weakness.  No visual changes.  Her symptoms are now constant.  Nothing seems to make them better.  Past Medical History:  Diagnosis Date  . Bipolar 1 disorder (HCC)   . Depression   . Fibromyalgia   . Hepatitis C   . Memory loss   . PTSD (post-traumatic stress disorder)     Patient Active Problem List   Diagnosis Date Noted  . Hepatitis C 10/30/2012  . Depression 10/30/2012  . Fibromyalgia 10/30/2012  . Bipolar 1 disorder (HCC) 10/30/2012  . PTSD (post-traumatic stress disorder) 10/30/2012  . Genital herpes 10/30/2012    Past Surgical History:  Procedure Laterality Date  . ABDOMINAL HYSTERECTOMY    . APPENDECTOMY    . Double mastectomy for famiy hx     4 yrs ago  . tonsillecdtomy      Prior to Admission medications   Medication Sig Start Date End Date Taking? Authorizing Provider  ACYCLOVIR PO Take 500 mg by mouth daily.    [provider]  baclofen (LIORESAL) 10 MG tablet  05/28/15   [provider]  budesonide-formoterol  (SYMBICORT) 160-4.5 MCG/ACT inhaler Inhale 2 puffs into the lungs 2 (two) times daily.    [provider]  clonazePAM (KLONOPIN) 0.5 MG tablet  05/08/15   [provider]  escitalopram (LEXAPRO) 10 MG tablet Take 10 mg by mouth. Patient states that she takes (2) 10 mg tablets by mouth in the morning.    [provider]  HYDROcodone-acetaminophen (NORCO/VICODIN) 5-325 MG per tablet Take 1 tablet by mouth every 6 (six) hours as needed for moderate pain.    [provider]  levothyroxine (SYNTHROID, LEVOTHROID) 75 MCG tablet Take 75 mcg by mouth daily before breakfast.    [provider]  lidocaine (XYLOCAINE) 5 % ointment Apply 1 application topically as needed. 11/18/17   Triplett, Rulon Eisenmenger B, FNP  loratadine (CLARITIN) 10 MG tablet Take 10 mg by mouth daily.    [provider]  mometasone (ASMANEX 120 METERED DOSES) 220 MCG/INH inhaler Inhale 2 puffs into the lungs daily.    [provider]  nitroGLYCERIN (NITROSTAT) 0.4 MG SL tablet Place 0.4 mg under the tongue every 5 (five) minutes as needed for chest pain.    [provider]  Nitroglycerin 0.4 % OINT Place 1 inch rectally 2 (two) times daily for 14 days. 11/18/17 12/02/17  Triplett, Rulon Eisenmenger B, FNP  norethindrone (AYGESTIN) 5 MG tablet Take 5 mg by mouth daily.    [provider]  pantoprazole (PROTONIX) 40 MG tablet Take 40 mg by mouth daily.  [provider]  simvastatin (ZOCOR) 20 MG tablet  06/03/15   [provider]  zolmitriptan (ZOMIG) 5 MG tablet Take 5 mg by mouth as needed for migraine.    [provider]  zolpidem (AMBIEN) 10 MG tablet Take 10 mg by mouth at bedtime as needed for sleep.    [provider]    Allergies Gabapentin; Sulfa antibiotics; and Zoloft [sertraline hcl]  No family history on file.  Social History Social History   Tobacco Use  . Smoking status: Never Smoker  . Smokeless tobacco: Never Used    Substance Use Topics  . Alcohol use: Yes    Alcohol/week: 0.0 oz    Comment: Occasionally etoh, every couple of month  . Drug use: No    Review of Systems Constitutional: No fever/chills Eyes: No visual changes. ENT: No sore throat. Cardiovascular: Denies chest pain. Respiratory: Denies shortness of breath. Gastrointestinal: No abdominal pain.  Positive for nausea, no vomiting.  No diarrhea.  No constipation. Genitourinary: Negative for dysuria. Musculoskeletal: Negative for back pain. Skin: Negative for rash. Neurological: Positive for headache   ____________________________________________   PHYSICAL EXAM:  VITAL SIGNS: ED Triage Vitals  Enc Vitals Group     BP 12/29/17 0434 114/75     Pulse Rate 12/29/17 0434 81     Resp 12/29/17 0434 18     Temp 12/29/17 0434 98 F (36.7 C)     Temp Source 12/29/17 0434 Oral     SpO2 12/29/17 0434 96 %     Weight 12/29/17 0425 150 lb (68 kg)     Height 12/29/17 0425 5\' 6"  (1.676 m)     Head Circumference --      Peak Flow --      Pain Score 12/29/17 0425 10     Pain Loc --      Pain Edu? --      Excl. in GC? --     Constitutional: Alert and oriented times fortunately her eyes in a darkened room and appears uncomfortable Eyes: PERRL EOMI. mid range and brisk Head: Atraumatic. Nose: No congestion/rhinnorhea. Mouth/Throat: No trismus Neck: No stridor.  No meningismus Cardiovascular: Normal rate, regular rhythm. Grossly normal heart sounds.  Good peripheral circulation. Respiratory: Normal respiratory effort.  No retractions. Lungs CTAB and moving good air Gastrointestinal: Soft nontender Musculoskeletal: No lower extremity edema   Neurologic:  Normal speech and language. No gross focal neurologic deficits are appreciated. Skin:  Skin is warm, dry and intact. No rash noted. Psychiatric: Mood and affect are normal. Speech and behavior are normal.    ____________________________________________   DIFFERENTIAL includes  but not limited to  Migraine headache, tension headache, glaucoma, meningitis, encephalitis ____________________________________________   LABS (all labs ordered are listed, but only abnormal results are displayed)  Labs Reviewed - No data to display   __________________________________________  EKG   ____________________________________________  RADIOLOGY   ____________________________________________   PROCEDURES  Procedure(s) performed: no  Procedures  Critical Care performed: no  Observation: no ____________________________________________   INITIAL IMPRESSION / ASSESSMENT AND PLAN / ED COURSE  Pertinent labs & imaging results that were available during my care of the patient were reviewed by me and considered in my medical decision making (see chart for details).  The patient arrives neuro intact with a significant migraine headache that is identical to previous.  Compazine Benadryl Toradol and fluids are pending.    ----------------------------------------- 5:50 AM on 12/29/2017 -----------------------------------------  The patient's pain is improved.  We will  continue to monitor.  ____________________________________________ ----------------------------------------- 6:46 AM on 12/29/2017 -----------------------------------------  The patient's headache is now nearly completely resolved and she remains neuro intact.  Will discharge home with primary care and neurology follow-up.  She verbalizes understanding and agreement the plan.  FINAL CLINICAL IMPRESSION(S) / ED DIAGNOSES  Final diagnoses:  Migraine without aura and without status migrainosus, not intractable      NEW MEDICATIONS STARTED DURING THIS VISIT:  New Prescriptions   No medications on file     Note:  This document was prepared using Dragon voice recognition software and may include unintentional dictation errors.     Merrily Brittle, MD 12/29/17 [phone]

## 2017-12-29 NOTE — ED Triage Notes (Signed)
Patient ambulatory to triage with steady gait, without difficulty or distress noted; pt reports x 3 days frontal/occipital migraine, accomp by nausea with hx of same unrelieved by her zomig and tylenol

## 2017-12-29 NOTE — Discharge Instructions (Addendum)
Please make sure you remain well-hydrated and follow-up with your primary care physician and your neurologist as needed.  Return to the emergency department for any concerns.  It was a pleasure to take care of you today, and thank you for coming to our emergency department.  If you have any questions or concerns before leaving please ask the nurse to grab me and I'm more than happy to go through your aftercare instructions again.  If you were prescribed any opioid pain medication today such as Norco, Vicodin, Percocet, morphine, hydrocodone, or oxycodone please make sure you do not drive when you are taking this medication as it can alter your ability to drive safely.  If you have any concerns once you are home that you are not improving or are in fact getting worse before you can make it to your follow-up appointment, please do not hesitate to call 911 and come back for further evaluation.  Merrily Brittle, MD

## 2017-12-29 NOTE — ED Notes (Signed)
Pt sleeping at this time, equal and unlabored resp noted 

## 2019-06-05 DIAGNOSIS — W57XXXA Bitten or stung by nonvenomous insect and other nonvenomous arthropods, initial encounter: Secondary | ICD-10-CM | POA: Diagnosis not present

## 2019-06-05 DIAGNOSIS — F419 Anxiety disorder, unspecified: Secondary | ICD-10-CM | POA: Diagnosis not present

## 2019-06-05 DIAGNOSIS — R51 Headache: Secondary | ICD-10-CM | POA: Diagnosis not present

## 2019-06-05 DIAGNOSIS — Z853 Personal history of malignant neoplasm of breast: Secondary | ICD-10-CM | POA: Diagnosis not present

## 2019-06-05 DIAGNOSIS — R112 Nausea with vomiting, unspecified: Secondary | ICD-10-CM | POA: Diagnosis not present

## 2019-06-05 DIAGNOSIS — Z79899 Other long term (current) drug therapy: Secondary | ICD-10-CM | POA: Diagnosis not present

## 2019-06-05 DIAGNOSIS — M797 Fibromyalgia: Secondary | ICD-10-CM | POA: Diagnosis not present

## 2019-06-05 DIAGNOSIS — S70361A Insect bite (nonvenomous), right thigh, initial encounter: Secondary | ICD-10-CM | POA: Diagnosis not present

## 2019-06-05 DIAGNOSIS — Z882 Allergy status to sulfonamides status: Secondary | ICD-10-CM | POA: Diagnosis not present

## 2019-06-05 DIAGNOSIS — F329 Major depressive disorder, single episode, unspecified: Secondary | ICD-10-CM | POA: Diagnosis not present

## 2020-01-30 ENCOUNTER — Ambulatory Visit: Payer: Medicare HMO | Attending: Internal Medicine

## 2020-08-26 ENCOUNTER — Emergency Department: Payer: Medicare HMO

## 2020-08-26 ENCOUNTER — Other Ambulatory Visit: Payer: Self-pay

## 2020-08-26 ENCOUNTER — Encounter: Payer: Self-pay | Admitting: Emergency Medicine

## 2020-08-26 ENCOUNTER — Emergency Department
Admission: EM | Admit: 2020-08-26 | Discharge: 2020-08-26 | Disposition: A | Discharge status: Home / Self Care | Payer: Medicare HMO | Attending: Emergency Medicine | Admitting: Emergency Medicine

## 2020-08-26 DIAGNOSIS — Z79899 Other long term (current) drug therapy: Secondary | ICD-10-CM | POA: Insufficient documentation

## 2020-08-26 DIAGNOSIS — F319 Bipolar disorder, unspecified: Secondary | ICD-10-CM | POA: Diagnosis not present

## 2020-08-26 DIAGNOSIS — R079 Chest pain, unspecified: Secondary | ICD-10-CM | POA: Diagnosis not present

## 2020-08-26 DIAGNOSIS — U071 COVID-19: Secondary | ICD-10-CM | POA: Diagnosis not present

## 2020-08-26 DIAGNOSIS — R0602 Shortness of breath: Secondary | ICD-10-CM | POA: Diagnosis not present

## 2020-08-26 DIAGNOSIS — Z7951 Long term (current) use of inhaled steroids: Secondary | ICD-10-CM | POA: Diagnosis not present

## 2020-08-26 DIAGNOSIS — J9811 Atelectasis: Secondary | ICD-10-CM | POA: Diagnosis not present

## 2020-08-26 LAB — CBC
HCT: 45.4 % (ref 36.0–46.0)
Hemoglobin: 15.3 g/dL — ABNORMAL HIGH (ref 12.0–15.0)
MCH: 28.7 pg (ref 26.0–34.0)
MCHC: 33.7 g/dL (ref 30.0–36.0)
MCV: 85 fL (ref 80.0–100.0)
Platelets: 155 10*3/uL (ref 150–400)
RBC: 5.34 MIL/uL — ABNORMAL HIGH (ref 3.87–5.11)
RDW: 13 % (ref 11.5–15.5)
WBC: 2.7 10*3/uL — ABNORMAL LOW (ref 4.0–10.5)
nRBC: 0 % (ref 0.0–0.2)

## 2020-08-26 LAB — BASIC METABOLIC PANEL
Anion gap: 12 (ref 5–15)
BUN: 8 mg/dL (ref 6–20)
CO2: 24 mmol/L (ref 22–32)
Calcium: 8.3 mg/dL — ABNORMAL LOW (ref 8.9–10.3)
Chloride: 100 mmol/L (ref 98–111)
Creatinine, Ser: 0.71 mg/dL (ref 0.44–1.00)
GFR calc non Af Amer: >60 mL/min (ref >60)
Glucose, Bld: 155 mg/dL — ABNORMAL HIGH (ref 70–99)
Potassium: 2.8 mmol/L — ABNORMAL LOW (ref 3.5–5.1)
Sodium: 136 mmol/L (ref 135–145)

## 2020-08-26 LAB — RESPIRATORY PANEL BY RT PCR (FLU A&B, COVID)
Influenza A by PCR: NEGATIVE
Influenza B by PCR: NEGATIVE
SARS Coronavirus 2 by RT PCR: POSITIVE — AB

## 2020-08-26 LAB — TROPONIN I (HIGH SENSITIVITY)
Troponin I (High Sensitivity): 6 ng/L (ref <18)
Troponin I (High Sensitivity): 7 ng/L (ref <18)

## 2020-08-26 MED ORDER — ALBUTEROL SULFATE HFA 108 (90 BASE) MCG/ACT IN AERS: 2.0000 | INHALATION_SPRAY | Freq: Four times a day (QID) | RESPIRATORY_TRACT | 0 refills | Status: AC | PRN

## 2020-08-26 MED ORDER — ACETAMINOPHEN 325 MG PO TABS
650.0000 mg | ORAL_TABLET | Freq: Once | ORAL | Status: AC | PRN
  Administered 2020-08-26: 650 mg via ORAL
  Filled 2020-08-26: qty 2

## 2020-08-26 MED ORDER — POTASSIUM CHLORIDE CRYS ER 20 MEQ PO TBCR
40.0000 meq | EXTENDED_RELEASE_TABLET | Freq: Once | ORAL | Status: DC
  Filled 2020-08-26: qty 2

## 2020-08-26 MED ORDER — ONDANSETRON HCL 4 MG PO TABS: 4.0000 mg | ORAL_TABLET | Freq: Three times a day (TID) | ORAL | 0 refills | Status: AC | PRN

## 2020-08-26 MED ORDER — SODIUM CHLORIDE 0.9 % IV BOLUS
1000.0000 mL | Freq: Once | INTRAVENOUS | Status: AC
  Administered 2020-08-26: 16:00:00 1000 mL via INTRAVENOUS

## 2020-08-26 MED ORDER — POTASSIUM CHLORIDE 20 MEQ PO PACK: 40.0000 meq | PACK | Freq: Once | ORAL | Status: DC

## 2020-08-26 MED ORDER — POTASSIUM CHLORIDE 20 MEQ/15ML (10%) PO SOLN
40.0000 meq | Freq: Once | ORAL | Status: AC
  Administered 2020-08-26: 17:00:00 40 meq via ORAL
  Filled 2020-08-26: qty 30

## 2020-08-26 NOTE — Discharge Instructions (Signed)
Please seek medical attention for any high fevers, chest pain, shortness of breath, change in behavior, persistent vomiting, bloody stool or any other new or concerning symptoms.  

## 2020-08-26 NOTE — ED Provider Notes (Signed)
[facility] °Emergency Department Provider Note ° ° °____________________________________________ ° ° °I have reviewed the triage vital signs and the nursing notes. ° ° °HISTORY ° °Chief Complaint °Feeling bad ° °History limited by: Not Limited ° ° °HPI °Shannon Chandler is a 54 y.o. female who presents to the emergency department today with multiple medical complaints. These include body aches, cough, shortness of breath, vomiting, decreased intake. Symptoms started 5 days ago and have been constant. The patient had family members with similar symptoms, one of whom tested positive for covid today. The patient has not received the covid vaccine. The patient denies any underlying or chronic lung disease.  ° ° °Records reviewed. Per medical record review patient has a history of fibromyalgia, depression. ° °Past Medical History:  °Diagnosis Date  °• Bipolar 1 disorder (HCC)   °• Depression   °• Fibromyalgia   °• Hepatitis C   °• Memory loss   °• PTSD (post-traumatic stress disorder)   ° ° °Patient Active Problem List  ° Diagnosis Date Noted  °• Hepatitis C 10/30/2012  °• Depression 10/30/2012  °• Fibromyalgia 10/30/2012  °• Bipolar 1 disorder (HCC) 10/30/2012  °• PTSD (post-traumatic stress disorder) 10/30/2012  °• Genital herpes 10/30/2012  ° ° °Past Surgical History:  °Procedure Laterality Date  °• ABDOMINAL HYSTERECTOMY    °• APPENDECTOMY    °• Double mastectomy for famiy hx    ° 4 yrs ago  °• tonsillecdtomy    ° ° °Prior to Admission medications   °Medication Sig Start Date End Date Taking? Authorizing Provider  °ACYCLOVIR PO Take 500 mg by mouth daily.    [provider]  °baclofen (LIORESAL) 10 MG tablet  05/28/15   [provider]  °budesonide-formoterol (SYMBICORT) 160-4.5 MCG/ACT inhaler Inhale 2 puffs into the lungs 2 (two) times daily.    [provider]  °clonazePAM (KLONOPIN) 0.5 MG tablet  05/08/15   [provider]  °escitalopram (LEXAPRO) 10 MG  tablet Take 10 mg by mouth. Patient states that she takes (2) 10 mg tablets by mouth in the morning.    [provider]  °HYDROcodone-acetaminophen (NORCO/VICODIN) 5-325 MG per tablet Take 1 tablet by mouth every 6 (six) hours as needed for moderate pain.    [provider]  °levothyroxine (SYNTHROID, LEVOTHROID) 75 MCG tablet Take 75 mcg by mouth daily before breakfast.    [provider]  °lidocaine (XYLOCAINE) 5 % ointment Apply 1 application topically as needed. 11/18/17   Triplett, Cari B, FNP  °loratadine (CLARITIN) 10 MG tablet Take 10 mg by mouth daily.    [provider]  °mometasone (ASMANEX 120 METERED DOSES) 220 MCG/INH inhaler Inhale 2 puffs into the lungs daily.    [provider]  °nitroGLYCERIN (NITROSTAT) 0.4 MG SL tablet Place 0.4 mg under the tongue every 5 (five) minutes as needed for chest pain.    [provider]  °Nitroglycerin 0.4 % OINT Place 1 inch rectally 2 (two) times daily for 14 days. 11/18/17 12/02/17  Triplett, Cari B, FNP  °norethindrone (AYGESTIN) 5 MG tablet Take 5 mg by mouth daily.    [provider]  °pantoprazole (PROTONIX) 40 MG tablet Take 40 mg by mouth daily.    [provider]  °simvastatin (ZOCOR) 20 MG tablet  06/03/15   [provider]  °zolmitriptan (ZOMIG) 5 MG tablet Take 5 mg by mouth as needed for migraine.    [provider]  °zolpidem (AMBIEN) 10 MG tablet Take   10 mg by mouth at bedtime as needed for sleep.    [provider]  ° ° °Allergies °Gabapentin, Sulfa antibiotics, and Zoloft [sertraline hcl] ° °No family history on file. ° °Social History °Social History  ° °Tobacco Use  °• Smoking status: Never Smoker  °• Smokeless tobacco: Never Used  °Substance Use Topics  °• Alcohol use: Yes  °  Alcohol/week: 0.0 standard drinks  °  Comment: Occasionally etoh, every couple of month  °• Drug use: No  ° ° °Review of Systems °Constitutional: Positive for fever. °Eyes: No  visual changes. °ENT: No sore throat. °Cardiovascular: Positive for chest pain. °Respiratory: Positive for shortness of breath. °Gastrointestinal:  Positive for vomiting and decreased oral intake. Positive for diarrhea.  °Genitourinary: Negative for dysuria. °Musculoskeletal: Positive for body aches.  °Skin: Negative for rash. °Neurological: Negative for focal weakness or numbness. ° °____________________________________________ ° ° °PHYSICAL EXAM: ° °VITAL SIGNS: °ED Triage Vitals  °Enc Vitals Group  °   BP 08/26/20 1036 99/60  °   Pulse Rate 08/26/20 1036 (!) 101  °   Resp 08/26/20 1036 (!) 22  °   Temp 08/26/20 1036 98.6 °F (37 °C)  °   Temp Source 08/26/20 1036 Oral  °   SpO2 08/26/20 1036 98 %  °   Weight 08/26/20 1034 155 lb (70.3 kg)  °   Height 08/26/20 1034 5' 6" (1.676 m)  °   Head Circumference --   °   Peak Flow --   °   Pain Score 08/26/20 1034 7  ° °Constitutional: Alert and oriented.  °Eyes: Conjunctivae are normal.  °ENT °     Head: Normocephalic and atraumatic. °     Nose: No congestion/rhinnorhea. °     Mouth/Throat: Mucous membranes are moist. °     Neck: No stridor. °Hematological/Lymphatic/Immunilogical: No cervical lymphadenopathy. °Cardiovascular: Normal rate, regular rhythm.  No murmurs, rubs, or gallops. °Respiratory: Normal respiratory effort without tachypnea nor retractions. Occasional cough.  °Gastrointestinal: Soft and non tender. No rebound. No guarding.  °Genitourinary: Deferred °Musculoskeletal: Normal range of motion in all extremities. No lower extremity edema. °Neurologic:  Normal speech and language. No gross focal neurologic deficits are appreciated.  °Skin:  Skin is warm, dry and intact. No rash noted. °Psychiatric: Mood and affect are normal. Speech and behavior are normal. Patient exhibits appropriate insight and judgment. ° °____________________________________________ °  ° °LABS (pertinent positives/negatives) ° °Trop hs 7 °BMP na 136, k 2.8, glu 155, cr 0.71 °CBC wbc 2.7,  hgb 15.3, plt 155 ° °____________________________________________ ° ° °EKG ° °I, Graydon Goodman, attending physician, personally viewed and interpreted this EKG ° °EKG Time: 1031 °Rate: 96 °Rhythm: normal sinus rhythm °Axis: normal °Intervals: qtc 432 °QRS: narrow °ST changes: no st elevation °Impression: normal ekg °____________________________________________ °  ° °RADIOLOGY ° °CXR °Left base atelectasis, lungs otherwise clear ° °____________________________________________ ° ° °PROCEDURES ° °Procedures ° °____________________________________________ ° ° °INITIAL IMPRESSION / ASSESSMENT AND PLAN / ED COURSE ° °Pertinent labs & imaging results that were available during my care of the patient were reviewed by me and considered in my medical decision making (see chart for details). °  °Patient presented to the emergency department today because of concern for feeling bad with myriad medical complaints. Patient did have known covid exposure and patient did test positive for covid. I do think this would explain the patient's symptoms. Will give prescription for albuterol inhaler and antiemetic. Will give patient's information to monoclonal antibody infusion clinic.  ° °  ____________________________________________ ° ° °FINAL CLINICAL IMPRESSION(S) / ED DIAGNOSES ° °Final diagnoses:  °COVID-19  ° ° ° °Note: This dictation was prepared with Dragon dictation. Any transcriptional errors that result from this process are unintentional ° ° °  °Goodman, Graydon, MD °08/26/20 1856 ° °

## 2020-08-26 NOTE — ED Notes (Signed)
Pt c/o having a headache now and is asking for tylenol. Aundra Millet, RN made aware and will give pt tylenol.

## 2020-08-26 NOTE — ED Triage Notes (Addendum)
Pt c/o "feeling real bad" since Friday. Pt daughter was sick Thursday.   Pt c/o chest and back pain, fever, vomiting, chills and dry cough. Pt states SOB and pt appears to be very uncomfortable at this time.   Pt states decreased PO intake.

## 2020-08-26 NOTE — ED Notes (Addendum)
Note entered in error

## 2020-08-27 ENCOUNTER — Telehealth (HOSPITAL_COMMUNITY): Payer: Self-pay | Admitting: Adult Health

## 2020-08-27 NOTE — Telephone Encounter (Signed)
Called and LMOM regarding monoclonal antibody treatment for COVID 19 given to those who are at risk for complications and/or hospitalization of the virus.  Patient meets criteria based on: BMi greater than 25  Call back number given: [phone]  My chart message: unable to send  Lillard Anes, NP

## 2021-07-12 ENCOUNTER — Ambulatory Visit
Admission: RE | Admit: 2021-07-12 | Discharge: 2021-07-12 | Disposition: A | Discharge status: Home / Self Care | Payer: Self-pay | Source: Ambulatory Visit | Attending: *Deleted | Admitting: *Deleted

## 2021-07-12 ENCOUNTER — Other Ambulatory Visit: Payer: Self-pay | Admitting: *Deleted

## 2021-07-12 DIAGNOSIS — Z1231 Encounter for screening mammogram for malignant neoplasm of breast: Secondary | ICD-10-CM

## 2022-02-03 ENCOUNTER — Emergency Department
Admission: EM | Admit: 2022-02-03 | Discharge: 2022-02-03 | Disposition: A | Discharge status: Home / Self Care | Payer: No Typology Code available for payment source | Attending: Emergency Medicine | Admitting: Emergency Medicine

## 2022-02-03 ENCOUNTER — Emergency Department: Payer: No Typology Code available for payment source

## 2022-02-03 ENCOUNTER — Other Ambulatory Visit: Payer: Self-pay

## 2022-02-03 DIAGNOSIS — M79671 Pain in right foot: Secondary | ICD-10-CM | POA: Insufficient documentation

## 2022-02-03 DIAGNOSIS — Z20822 Contact with and (suspected) exposure to covid-19: Secondary | ICD-10-CM | POA: Insufficient documentation

## 2022-02-03 DIAGNOSIS — J181 Lobar pneumonia, unspecified organism: Secondary | ICD-10-CM | POA: Diagnosis not present

## 2022-02-03 DIAGNOSIS — R0602 Shortness of breath: Secondary | ICD-10-CM | POA: Diagnosis present

## 2022-02-03 LAB — CBC WITH DIFFERENTIAL/PLATELET
Abs Immature Granulocytes: 0.06 10*3/uL (ref 0.00–0.07)
Basophils Absolute: 0.1 10*3/uL (ref 0.0–0.1)
Basophils Relative: 1 %
Eosinophils Absolute: 0.2 10*3/uL (ref 0.0–0.5)
Eosinophils Relative: 3 %
HCT: 40.8 % (ref 36.0–46.0)
Hemoglobin: 12.7 g/dL (ref 12.0–15.0)
Immature Granulocytes: 1 %
Lymphocytes Relative: 34 %
Lymphs Abs: 2.5 10*3/uL (ref 0.7–4.0)
MCH: 27.4 pg (ref 26.0–34.0)
MCHC: 31.1 g/dL (ref 30.0–36.0)
MCV: 87.9 fL (ref 80.0–100.0)
Monocytes Absolute: 0.6 10*3/uL (ref 0.1–1.0)
Monocytes Relative: 9 %
Neutro Abs: 3.9 10*3/uL (ref 1.7–7.7)
Neutrophils Relative %: 52 %
Platelets: 345 10*3/uL (ref 150–400)
RBC: 4.64 MIL/uL (ref 3.87–5.11)
RDW: 13.6 % (ref 11.5–15.5)
WBC: 7.3 10*3/uL (ref 4.0–10.5)
nRBC: 0 % (ref 0.0–0.2)

## 2022-02-03 LAB — BASIC METABOLIC PANEL
Anion gap: 8 (ref 5–15)
BUN: 16 mg/dL (ref 6–20)
CO2: 27 mmol/L (ref 22–32)
Calcium: 8.5 mg/dL — ABNORMAL LOW (ref 8.9–10.3)
Chloride: 102 mmol/L (ref 98–111)
Creatinine, Ser: 0.73 mg/dL (ref 0.44–1.00)
GFR, Estimated: >60 mL/min (ref >60)
Glucose, Bld: 74 mg/dL (ref 70–99)
Potassium: 4.1 mmol/L (ref 3.5–5.1)
Sodium: 137 mmol/L (ref 135–145)

## 2022-02-03 LAB — TROPONIN I (HIGH SENSITIVITY): Troponin I (High Sensitivity): 3 ng/L (ref <18)

## 2022-02-03 LAB — RESP PANEL BY RT-PCR (FLU A&B, COVID) ARPGX2
Influenza A by PCR: NEGATIVE
Influenza B by PCR: NEGATIVE
SARS Coronavirus 2 by RT PCR: NEGATIVE

## 2022-02-03 MED ORDER — LIDOCAINE HCL (PF) 1 % IJ SOLN
2.1000 mL | Freq: Once | INTRAMUSCULAR | Status: AC
  Administered 2022-02-03: 2.1 mL
  Filled 2022-02-03: qty 5

## 2022-02-03 MED ORDER — MELOXICAM 15 MG PO TABS: 15.0000 mg | ORAL_TABLET | Freq: Every day | ORAL | 0 refills | Status: AC

## 2022-02-03 MED ORDER — CEFTRIAXONE SODIUM 1 G IJ SOLR
1.0000 g | Freq: Once | INTRAMUSCULAR | Status: AC
  Administered 2022-02-03: 1 g via INTRAMUSCULAR
  Filled 2022-02-03: qty 10

## 2022-02-03 MED ORDER — AZITHROMYCIN 250 MG PO TABS: ORAL_TABLET | ORAL | 0 refills | Status: AC

## 2022-02-03 NOTE — ED Triage Notes (Signed)
Pt presents via POV with nasal congestions for several weeks with associated cough. She notes her daughter coming home with a cold a few weeks ago and she hasnt gotten better with OTC meds. Denies CP or SOB. ?

## 2022-02-03 NOTE — ED Notes (Signed)
Cold like s/s intermittent for the past couple months. Covid negative test approx 2 months ago. Over time states she has developed a cough, HA, chest discomfort with cough and states that it hurts to breath. Productive green-yellow cough. Came to ER today because she states that it is hard to breath with all the other s/s. On Random, C/o R foot pain for a couple months. Stated that she fell. Then hit the ankle a couple days ago. No bruising or swelling. Able to bear some weight.  ?

## 2022-02-03 NOTE — ED Provider Notes (Signed)
? ?[facility] ?Provider Note ? ? ? Event Date/Time  ? First MD Initiated Contact with Patient 02/03/22 2048   ?  (approximate) ? ? ?History  ? ?Nasal Congestion ? ? ?HPI ? ?Shannon Chandler is a 56 y.o. female with a history of hep C, fibromyalgia, PTSD, bipolar 1 and as listed in EMR presents to the emergency department for treatment and evaluation of nasal congestion, cough and pain in the chest with deep breath.  Symptoms started a couple of months ago after her daughter came home with a cold.  No improvement with over-the-counter medications. No fever. ?  ? ?Physical Exam  ? ?Triage Vital Signs: ?ED Triage Vitals  ?Enc Vitals Group  ?   BP 02/03/22 1959 107/64  ?   Pulse Rate 02/03/22 1959 82  ?   Resp 02/03/22 1959 18  ?   Temp 02/03/22 1959 98.2 ?F (36.8 ?C)  ?   Temp Source 02/03/22 1959 Oral  ?   SpO2 02/03/22 1959 96 %  ?   Weight 02/03/22 2003 120 lb (54.4 kg)  ?   Height 02/03/22 2003 5\' 6"  (1.676 m)  ?   Head Circumference --   ?   Peak Flow --   ?   Pain Score 02/03/22 2000 6  ?   Pain Loc --   ?   Pain Edu? --   ?   Excl. in [place]? --   ? ? ?Most recent vital signs: ?Vitals:  ? 02/03/22 1959 02/03/22 2236  ?BP: 107/64 (!) 108/55  ?Pulse: 82 77  ?Resp: 18 20  ?Temp: 98.2 ?F (36.8 ?C)   ?SpO2: 96% 97%  ? ? ?General: Awake, no distress.  ?CV:  Good peripheral perfusion.  ?Resp:  Normal effort. Breath sounds diminished bilateral lower ?Abd:  No distention. Nontender on palpation ?Other:  Right foot diffusely tender along the lateral aspect from the fifth toe to the calcaneus.  No obvious deformity or wound. ? ? ?ED Results / Procedures / Treatments  ? ?Labs ?(all labs ordered are listed, but only abnormal results are displayed) ?Labs Reviewed  ?BASIC METABOLIC PANEL - Abnormal; Notable for the following components:  ?    Result Value  ? Calcium 8.5 (*)   ? All other components within normal limits  ?RESP PANEL BY RT-PCR (FLU A&B, COVID) [id number]  ?CBC WITH DIFFERENTIAL/PLATELET  ?TROPONIN  I (HIGH SENSITIVITY)  ? ? ? ?EKG ? ?ED ECG REPORT ?I, Sherrie George, FNP-BC personally viewed and interpreted this ECG. ? ? Date: 02/04/2022 ? EKG Time: 2115 ? Rate: 77 ? Rhythm: normal EKG, normal sinus rhythm, unchanged from previous tracings ? Axis: normal ? Intervals:none ? ST&T Change: no ? ? ? ? ?RADIOLOGY ? ?Image and radiology report reviewed by me. ? ?No focal opacity in lung fields, however abnormal marking in right lower lobe ? ? ?PROCEDURES: ? ?Critical Care performed: No ? ?Procedures ? ? ?MEDICATIONS ORDERED IN ED: ?Medications  ?cefTRIAXone (ROCEPHIN) injection 1 g (1 g Intramuscular Given 02/03/22 2218)  ?lidocaine (PF) (XYLOCAINE) 1 % injection 2.1 mL (2.1 mLs Other Given 02/03/22 2218)  ? ? ? ?IMPRESSION / MDM / ASSESSMENT AND PLAN / ED COURSE  ? ?I have reviewed the triage note. ? ?Differential diagnosis includes, but is not limited to, pneumonia, Covid, Influenza, bronchitis ? ?56 year old female presenting to the emergency department for symptoms as described in the HPI. ? ?Influenza and COVID testing is negative.  Her chest x-ray does show some  concerns for an atypical pneumonia.  Her symptoms have been ongoing for the past several weeks and with her comorbidities, plan will be to treat her with some azithromycin.  She is to follow-up with primary care for symptoms that are not improving over the next several days with medications.  She is to return to the emergency department for symptoms that change or worsen if unable to schedule an appointment. ? ?  ? ? ?FINAL CLINICAL IMPRESSION(S) / ED DIAGNOSES  ? ?Final diagnoses:  ?Community acquired pneumonia of right lower lobe of lung  ?Right foot pain  ? ? ? ?Rx / DC Orders  ? ?ED Discharge Orders   ? ?      Ordered  ?  azithromycin (ZITHROMAX) 250 MG tablet       ? 02/03/22 2246  ?  meloxicam (MOBIC) 15 MG tablet  Daily       ? 02/03/22 2246  ? ?  ?  ? ?  ? ? ? ?Note:  This document was prepared using Dragon voice recognition software and may  include unintentional dictation errors. ?  Victorino Dike, FNP ?02/04/22 1548 ? ?  ?Nance Pear, MD ?02/07/22 1531 ? ?

## 2022-04-24 ENCOUNTER — Other Ambulatory Visit: Payer: Self-pay

## 2022-04-24 ENCOUNTER — Encounter: Payer: Self-pay | Admitting: Emergency Medicine

## 2022-04-24 ENCOUNTER — Emergency Department
Admission: EM | Admit: 2022-04-24 | Discharge: 2022-04-24 | Disposition: A | Discharge status: Home / Self Care | Payer: No Typology Code available for payment source | Attending: Emergency Medicine | Admitting: Emergency Medicine

## 2022-04-24 ENCOUNTER — Emergency Department: Payer: No Typology Code available for payment source

## 2022-04-24 DIAGNOSIS — R202 Paresthesia of skin: Secondary | ICD-10-CM | POA: Diagnosis not present

## 2022-04-24 DIAGNOSIS — M25551 Pain in right hip: Secondary | ICD-10-CM | POA: Insufficient documentation

## 2022-04-24 DIAGNOSIS — M7989 Other specified soft tissue disorders: Secondary | ICD-10-CM | POA: Insufficient documentation

## 2022-04-24 LAB — CBG MONITORING, ED: Glucose-Capillary: 102 mg/dL — ABNORMAL HIGH (ref 70–99)

## 2022-04-24 MED ORDER — NAPROXEN 250 MG PO TABS: 250.0000 mg | ORAL_TABLET | Freq: Two times a day (BID) | ORAL | 0 refills | Status: AC

## 2022-04-24 MED ORDER — NAPROXEN 500 MG PO TABS
250.0000 mg | ORAL_TABLET | Freq: Once | ORAL | Status: AC
  Administered 2022-04-24: 250 mg via ORAL
  Filled 2022-04-24: qty 1

## 2022-04-24 NOTE — ED Notes (Signed)
Dc ppw provided to patient. questions, followup and rx information reviewed as needed. pt provides verbal consent for dc at this time. pt denies vs at time of dc. Pt alert and oriented to lobby on foot at this time. 

## 2022-04-24 NOTE — ED Provider Notes (Signed)
[facility] Provider Note    Event Date/Time   First MD Initiated Contact with Patient 04/24/22 1055     (approximate)   History   Hip Pain, Foot Pain, and Leg Swelling   HPI  Shannon Chandler is a 56 y.o. female with past medical history of fibromyalgia bipolar disorder and PTSD presents with right hip pain.  There is no preceding trauma.  Pain started about 3 to 4 days ago.  Is located in the right buttock/hip radiates down to the right buttock.  She denies numbness or tingling in the leg.  Still able to ambulate.  Denies abdominal pain fevers chills.  No history of similar.  Patient has had sciatica before says this does not feel the same.  She also endorses paresthesias in her feet and foot swelling that been going on for several months.  She follows at the [place].  Received was taking naproxen for pain but does not have this anymore.    Past Medical History:  Diagnosis Date   Bipolar 1 disorder (HCC)    Depression    Fibromyalgia    Hepatitis C    Memory loss    PTSD (post-traumatic stress disorder)     Patient Active Problem List   Diagnosis Date Noted   Hepatitis C 10/30/2012   Depression 10/30/2012   Fibromyalgia 10/30/2012   Bipolar 1 disorder (HCC) 10/30/2012   PTSD (post-traumatic stress disorder) 10/30/2012   Genital herpes 10/30/2012     Physical Exam  Triage Vital Signs: ED Triage Vitals  Enc Vitals Group     BP 04/24/22 1009 121/75     Pulse Rate 04/24/22 1009 83     Resp 04/24/22 1009 20     Temp 04/24/22 1009 97.9 F (36.6 C)     Temp Source 04/24/22 1009 Oral     SpO2 04/24/22 1009 95 %     Weight 04/24/22 1007 132 lb (59.9 kg)     Height 04/24/22 1007 5\' 6"  (1.676 m)     Head Circumference --      Peak Flow --      Pain Score 04/24/22 1007 8     Pain Loc --      Pain Edu? --      Excl. in [place]? --     Most recent vital signs: Vitals:   04/24/22 1009  BP: 121/75  Pulse: 83  Resp: 20  Temp: 97.9 F (36.6 C)   SpO2: 95%     General: Awake, no distress.  CV:  Good peripheral perfusion.  Resp:  Normal effort.  Abd:  No distention.  Neuro:             Awake, Alert, Oriented x 3  Other:  TTP over the R SI joint and R ASIS Able to range the hip without pain  5/5 strength with plantarflexion/dorsiflexion, hip flexion BL   ED Results / Procedures / Treatments  Labs (all labs ordered are listed, but only abnormal results are displayed) Labs Reviewed  CBG MONITORING, ED - Abnormal; Notable for the following components:      Result Value   Glucose-Capillary 102 (*)    All other components within normal limits     EKG     RADIOLOGY I reviewed and interpreted the right hip and pelvis x-ray which is negative for fracture   PROCEDURES:  Critical Care performed: No  Procedures   MEDICATIONS ORDERED IN ED: Medications  naproxen (NAPROSYN) tablet 250  mg (250 mg Oral Given 04/24/22 1125)     IMPRESSION / MDM / ASSESSMENT AND PLAN / ED COURSE  I reviewed the triage vital signs and the nursing notes.                              Patient's presentation is most consistent with acute, uncomplicated illness.  Differential diagnosis includes, but is not limited to, sciatica, hip arthritis, stress fracture, trochanteric bursitis  Patient is a 56 year old female presenting with atraumatic right hip/back pain.  Located in the right hip and around the right SI area radiates to the right buttock.  Has no numbness or tingling no weakness in the leg.  She appears well on exam she is both tender over the right ASIS and right SI region.  Suspect sciatica versus hip osteoarthritis.  She has good strength in her extremities.  Low suspicion for fracture given no trauma.  Infectious process also less likely given normal vital signs no fevers.  We will obtain an x-ray to evaluate for occult fracture.  Plan to treat with NSAIDs discussed orthopedic follow-up if symptoms or not improving.  Also wanted to be  screened for diabetes so I checked her blood sugar and it was 102.  X-ray is negative.  Will discharge with prescription for naproxen.  Clinical Course as of 04/24/22 1156  Sun Apr 24, 2022  1128 Glucose-Capillary(!): 102 [KM]    Clinical Course User Index [KM] Georga Hacking, MD     FINAL CLINICAL IMPRESSION(S) / ED DIAGNOSES   Final diagnoses:  Right hip pain     Rx / DC Orders   ED Discharge Orders          Ordered    naproxen (NAPROSYN) 250 MG tablet  2 times daily with meals        04/24/22 1156             Note:  This document was prepared using Dragon voice recognition software and may include unintentional dictation errors.   Georga Hacking, MD 04/24/22 [id number]

## 2022-04-24 NOTE — ED Notes (Signed)
56 yof with a c/c of right sided hip pain for 2 weeks. The pt is also c/o bilateral lower leg extremity swelling for 6 to 7 months.

## 2022-04-24 NOTE — ED Triage Notes (Signed)
Pt reports for the last several weeks she has had pain to her right hip that moves all the way down to her foot. Pt denies injuries

## 2022-10-10 ENCOUNTER — Other Ambulatory Visit: Payer: Self-pay

## 2022-10-10 ENCOUNTER — Emergency Department
Admission: EM | Admit: 2022-10-10 | Discharge: 2022-10-10 | Disposition: A | Discharge status: Home / Self Care | Payer: No Typology Code available for payment source | Attending: Emergency Medicine | Admitting: Emergency Medicine

## 2022-10-10 ENCOUNTER — Emergency Department: Payer: No Typology Code available for payment source

## 2022-10-10 DIAGNOSIS — J069 Acute upper respiratory infection, unspecified: Secondary | ICD-10-CM | POA: Insufficient documentation

## 2022-10-10 DIAGNOSIS — J45909 Unspecified asthma, uncomplicated: Secondary | ICD-10-CM | POA: Insufficient documentation

## 2022-10-10 DIAGNOSIS — R0981 Nasal congestion: Secondary | ICD-10-CM | POA: Diagnosis present

## 2022-10-10 LAB — BASIC METABOLIC PANEL
Anion gap: 8 (ref 5–15)
BUN: 10 mg/dL (ref 6–20)
CO2: 23 mmol/L (ref 22–32)
Calcium: 8.6 mg/dL — ABNORMAL LOW (ref 8.9–10.3)
Chloride: 106 mmol/L (ref 98–111)
Creatinine, Ser: 0.7 mg/dL (ref 0.44–1.00)
GFR, Estimated: >60 mL/min (ref >60)
Glucose, Bld: 90 mg/dL (ref 70–99)
Potassium: 4.2 mmol/L (ref 3.5–5.1)
Sodium: 137 mmol/L (ref 135–145)

## 2022-10-10 LAB — TROPONIN I (HIGH SENSITIVITY): Troponin I (High Sensitivity): <2 ng/L (ref <18)

## 2022-10-10 MED ORDER — GUAIFENESIN-CODEINE 100-10 MG/5ML PO SOLN: 10.0000 mL | Freq: Three times a day (TID) | ORAL | 0 refills | Status: AC | PRN

## 2022-10-10 MED ORDER — DEXAMETHASONE 10 MG/ML FOR PEDIATRIC ORAL USE
10.0000 mg | Freq: Once | INTRAMUSCULAR | Status: AC
  Administered 2022-10-10: 10 mg via ORAL
  Filled 2022-10-10: qty 1

## 2022-10-10 NOTE — ED Notes (Signed)
See triage note  Presents with some cough and chest congestion for couple of days  Afebrile on arrival   States she was having some discomfort in chest with cough   Then developed some vomiting this am

## 2022-10-10 NOTE — ED Triage Notes (Signed)
Pt presents to ED via POV with c/o of SOB and congestion for the past few days. Pt does smoke and have vape and has HX of asthma. NAD noted. Pt speaking in full sentences with no issues. Pt unsure of sick contacts.

## 2022-10-10 NOTE — ED Provider Notes (Signed)
[facility] Provider Note    Event Date/Time   First MD Initiated Contact with Patient 10/10/22 1108     (approximate)   History   Shortness of Breath   HPI  Shannon Chandler is a 56 y.o. female who presents for evaluation of 4 to 5 days of cough, nasal congestion, runny nose, and general malaise.  She vapes but does not smoke cigarettes.  She was told that she has a history of asthma.  She feels a little bit of increasing shortness of breath with exertion.  Subjective fever and chills.  No chest pain or abdominal pain.  1 episode of vomiting this morning but that was after a particularly violent coughing episode.     Physical Exam   Triage Vital Signs: ED Triage Vitals  Enc Vitals Group     BP 10/10/22 1047 (!) 106/94     Pulse Rate 10/10/22 1047 83     Resp 10/10/22 1047 18     Temp 10/10/22 1047 98.5 F (36.9 C)     Temp Source 10/10/22 1047 Oral     SpO2 10/10/22 1047 94 %     Weight 10/10/22 1109 59.9 kg (132 lb 0.9 oz)     Height 10/10/22 1109 1.676 m (5\' 6" )     Head Circumference --      Peak Flow --      Pain Score 10/10/22 1044 5     Pain Loc --      Pain Edu? --      Excl. in [place]? --     Most recent vital signs: Vitals:   10/10/22 1047  BP: (!) 106/94  Pulse: 83  Resp: 18  Temp: 98.5 F (36.9 C)  SpO2: 94%     General: Awake, no distress.  Appears ill as if from viral illness but nontoxic. CV:  Good peripheral perfusion.  Normal heart sounds, regular rate and rhythm. Resp:  Normal effort.  Lungs are clear to auscultation bilaterally with no wheezing.  However the patient has a frequent cough. Abd:  No distention.  No tenderness to palpation.    ED Results / Procedures / Treatments   Labs (all labs ordered are listed, but only abnormal results are displayed) Labs Reviewed  BASIC METABOLIC PANEL - Abnormal; Notable for the following components:      Result Value   Calcium 8.6 (*)    All other components within  normal limits  POC URINE PREG, ED  TROPONIN I (HIGH SENSITIVITY)     EKG  ED ECG REPORT I, Hinda Kehr, the attending physician, personally viewed and interpreted this ECG.  Date: 10/10/2022 EKG Time: 10:55 AM Rate: 82 Rhythm: normal sinus rhythm QRS Axis: normal Intervals: normal ST/T Wave abnormalities: normal Narrative Interpretation: no evidence of acute ischemia    RADIOLOGY I viewed and interpreted the patient's two-view chest x-ray.  There is no evidence of pneumonia or pulmonary edema.  I also read the radiologist's report, which confirmed no acute findings.    PROCEDURES:  Critical Care performed: No  Procedures   MEDICATIONS ORDERED IN ED: Medications  dexamethasone (DECADRON) 10 MG/ML injection for Pediatric ORAL use 10 mg (10 mg Oral Given 10/10/22 1152)     IMPRESSION / MDM / ASSESSMENT AND PLAN / ED COURSE  I reviewed the triage vital signs and the nursing notes.  Differential diagnosis includes, but is not limited to, viral illness, community-acquired pneumonia, new diagnosis of heart failure, less likely ACS or PE.  Patient's presentation is most consistent with acute presentation with potential threat to life or bodily function.   Vital signs are stable and within normal limits.  Labs/studies ordered: EKG, two-view chest x-ray, basic metabolic panel, high-sensitivity troponin.  Labs are within normal limits.  No indication to repeat a high-sensitivity troponin since she is not having chest pain and has had symptoms for 4 to 5 days.  She is low risk for ACS and she has a Wells score for PE of 0.  As documented above her chest x-ray is clear.  There is no evidence of ischemia on EKG.  Signs, symptoms, and history are all very consistent with a viral URI which may be exacerbating some underlying asthma.  She is in no significant distress.  I ordered Decadron 10 mg p.o. to help but she does not need a prescription for  prednisone.  I checked the [place] controlled substance database and there are no concerning prescribing habits so I wrote a prescription for cough medication with hydrocodone to help with her symptoms.  She will follow-up as an outpatient.  I gave my usual and customary return precautions.      FINAL CLINICAL IMPRESSION(S) / ED DIAGNOSES   Final diagnoses:  Viral URI with cough     Rx / DC Orders   ED Discharge Orders          Ordered    guaiFENesin-codeine 100-10 MG/5ML syrup  3 times daily PRN        10/10/22 1205             Note:  This document was prepared using Dragon voice recognition software and may include unintentional dictation errors.   Loleta Rose, MD 10/10/22 [phone]

## 2023-01-07 IMAGING — CR DG HIP (WITH OR WITHOUT PELVIS) 2-3V*R*
1 series · 3 of 3 positions shown · non-contrast
Comparison: Right hip series 06/22/2017.

CLINICAL DATA: 55-year-old female with 2 weeks of right hip pain.
Bilateral lower extremity swelling.

EXAM:
DG HIP (WITH OR WITHOUT PELVIS) 2-3V RIGHT

[Series 1: dg hip unilat w or w/o pelvis 2-3 views  · non-contrast · 0.14mm/px · 3 of 3 slices shown]
[im 1/3]
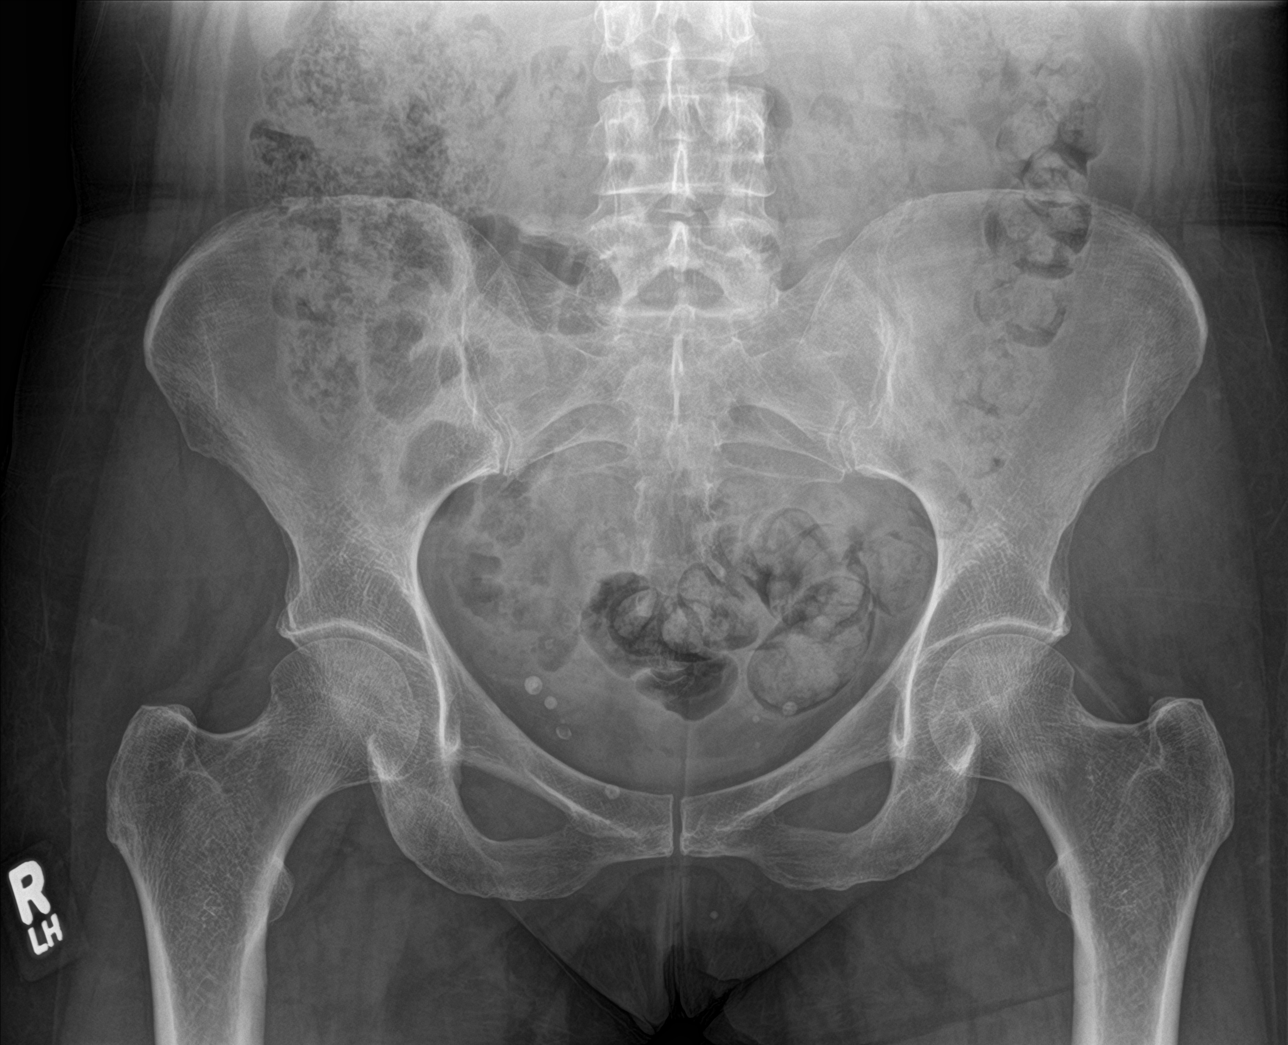
[im 2/3]
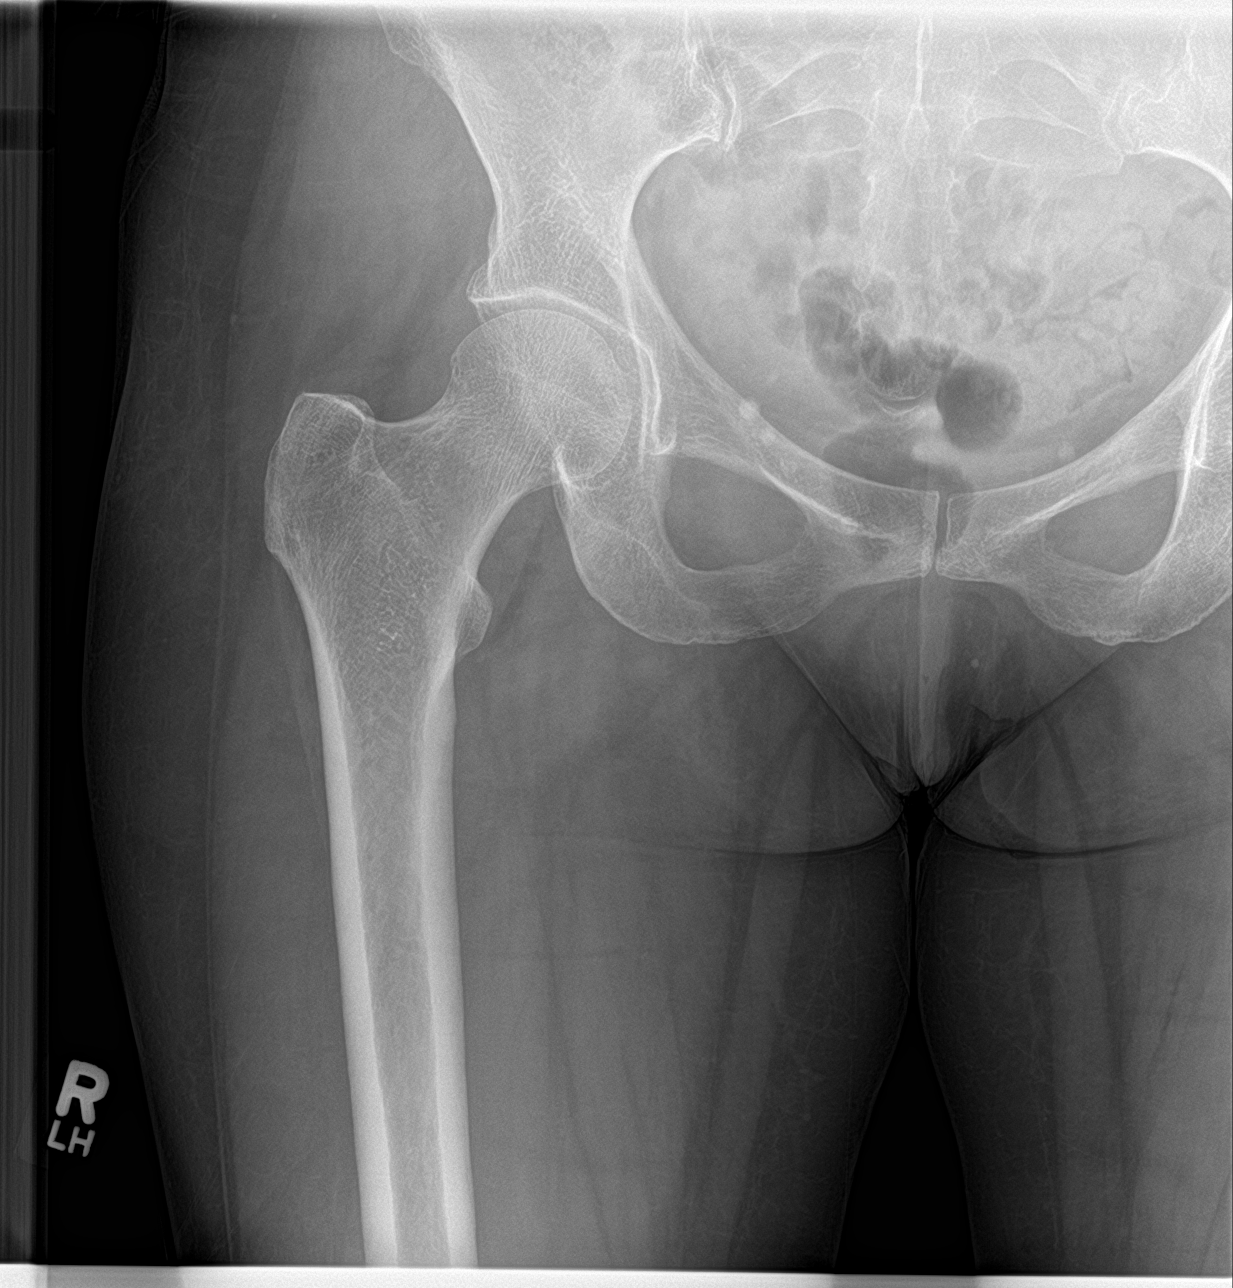
[im 3/3]
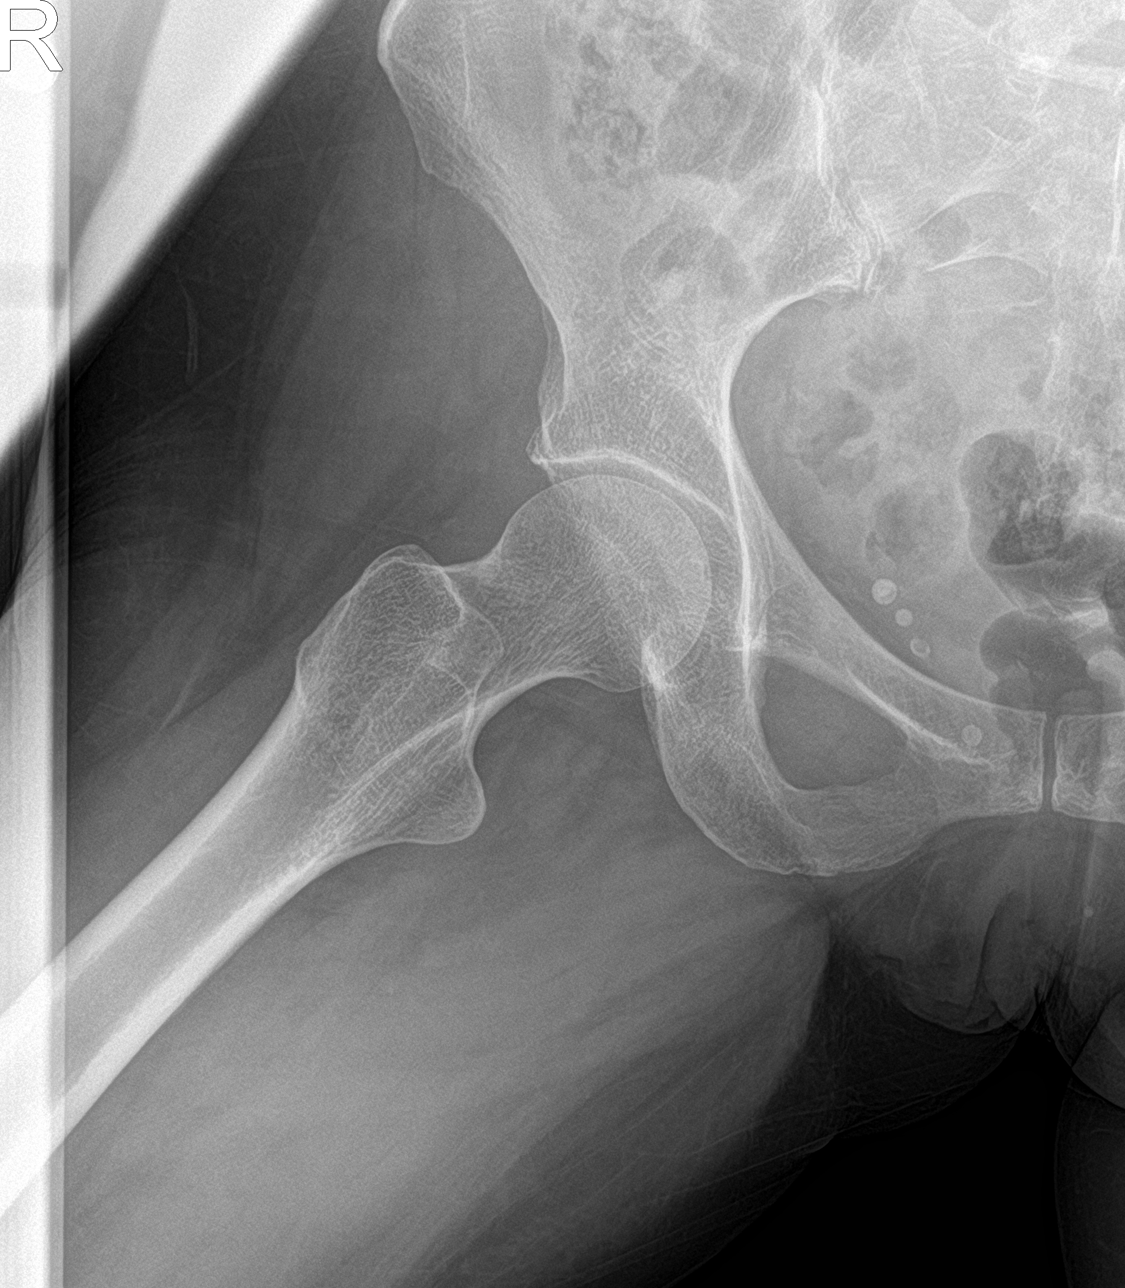

[3 of 3 positions shown; findings below may reference images not displayed]

FINDINGS: Femoral heads remain normally located. Hip joint spaces appear
stable since 9790 and within normal limits for age. Pelvis appears
stable and intact. SI joints appear symmetric. Incidental pelvic
phleboliths. Nonobstructed visible bowel gas pattern with retained
stool. Grossly intact proximal left femur. Proximal right femur
appears stable and intact.
IMPRESSION: Negative for age radiographic appearance of the right hip and
pelvis.

## 2023-05-18 ENCOUNTER — Emergency Department: Payer: No Typology Code available for payment source

## 2023-05-18 ENCOUNTER — Encounter: Payer: Self-pay | Admitting: Emergency Medicine

## 2023-05-18 ENCOUNTER — Other Ambulatory Visit: Payer: Self-pay

## 2023-05-18 ENCOUNTER — Emergency Department
Admission: EM | Admit: 2023-05-18 | Discharge: 2023-05-18 | Disposition: A | Discharge status: Home / Self Care | Payer: No Typology Code available for payment source | Attending: Emergency Medicine | Admitting: Emergency Medicine

## 2023-05-18 DIAGNOSIS — S92514A Nondisplaced fracture of proximal phalanx of right lesser toe(s), initial encounter for closed fracture: Secondary | ICD-10-CM | POA: Diagnosis not present

## 2023-05-18 DIAGNOSIS — Y9301 Activity, walking, marching and hiking: Secondary | ICD-10-CM | POA: Insufficient documentation

## 2023-05-18 DIAGNOSIS — M7989 Other specified soft tissue disorders: Secondary | ICD-10-CM | POA: Diagnosis not present

## 2023-05-18 DIAGNOSIS — W108XXA Fall (on) (from) other stairs and steps, initial encounter: Secondary | ICD-10-CM | POA: Insufficient documentation

## 2023-05-18 DIAGNOSIS — Y9222 Religious institution as the place of occurrence of the external cause: Secondary | ICD-10-CM | POA: Diagnosis not present

## 2023-05-18 DIAGNOSIS — S99921A Unspecified injury of right foot, initial encounter: Secondary | ICD-10-CM | POA: Diagnosis present

## 2023-05-18 MED ORDER — IBUPROFEN 800 MG PO TABS: 800.0000 mg | ORAL_TABLET | Freq: Three times a day (TID) | ORAL | 0 refills | Status: AC | PRN

## 2023-05-18 MED ORDER — IBUPROFEN 800 MG PO TABS
800.0000 mg | ORAL_TABLET | Freq: Once | ORAL | Status: AC
  Administered 2023-05-18: 800 mg via ORAL
  Filled 2023-05-18: qty 1

## 2023-05-18 NOTE — Discharge Instructions (Addendum)
Rest Elevate toe  Apply ice as needed

## 2023-05-18 NOTE — ED Triage Notes (Signed)
Pt here with a right foot injury falling going up some steps. Pt still able to ambulate on affected foot just painful.

## 2023-05-18 NOTE — ED Provider Notes (Signed)
[facility] Emergency Department Provider Note     Event Date/Time   First MD Initiated Contact with Patient 05/18/23 1422     (approximate)   History   Foot Injury   HPI  Shannon Chandler is a 57 y.o. female presents to the ED for right foot injury.  Patient reports she was walking up steps at church and hit her right pinky toe directly onto the step. Immediate localized pain to lateral foot. Patient reports previous injury to same toe 4 years ago with similar mechanical of injury without medical evaluation or treatment. Pain score 10/10. Nothing for pain tried. Worse with walking. Patient is ambulating. Denies weakness, numbness and decreased range of motion.      Physical Exam   Triage Vital Signs: ED Triage Vitals  Enc Vitals Group     BP 05/18/23 1308 133/82     Pulse Rate 05/18/23 1308 92     Resp 05/18/23 1308 12     Temp 05/18/23 1308 98.4 F (36.9 C)     Temp Source 05/18/23 1308 Oral     SpO2 05/18/23 1308 94 %     Weight 05/18/23 1309 132 lb 0.9 oz (59.9 kg)     Height 05/18/23 1309 5\' 6"  (1.676 m)     Head Circumference --      Peak Flow --      Pain Score 05/18/23 1309 9     Pain Loc --      Pain Edu? --      Excl. in [place]? --     Most recent vital signs: Vitals:   05/18/23 1308  BP: 133/82  Pulse: 92  Resp: 12  Temp: 98.4 F (36.9 C)  SpO2: 94%    General Awake, no distress.  HEENT NCAT. PERRL. EOMI. No rhinorrhea. Mucous membranes are moist.  CV:  Good peripheral perfusion.  RESP:  Normal effort.  ABD:  No distention.  Other:  Right foot reveals no visible deformities, ecchymosis.  Skin is intact.  Moderate edema over proximal fifth phalanx.  Tender to palpation.  No palpable step-off.  Full active range of motion.  Pain with flexion and full extension.  Neurovascular status intact throughout.  capillary refill brisk.  Full ROM with dorsiflexion and plantarflexion without difficulty.  Patient is ambulatory.  Gait is  steady.  Left foot is unremarkable.   ED Results / Procedures / Treatments   Labs (all labs ordered are listed, but only abnormal results are displayed) Labs Reviewed - No data to display  RADIOLOGY  I personally viewed and evaluated these images as part of my medical decision making, as well as reviewing the written report by the radiologist.  ED Provider Interpretation: Nondisplaced transverse fracture of the fifth proximal phalanx  DG Foot Complete Right  Result Date: 05/18/2023 CLINICAL DATA:  Foot pain after falling on stairs. Pain in great toe. EXAM: RIGHT FOOT COMPLETE - 3+ VIEW COMPARISON:  Radiographs 02/03/2022. FINDINGS: Mild irregularity of the neck of the 5th proximal phalanx with adjacent soft tissue calcifications suspicious for a healing subacute fracture. No evidence of acute fracture or dislocation. The great toe appears intact. The joint spaces are preserved. IMPRESSION: 1. No evidence of acute fracture or dislocation in the great toe. 2. Possible subacute healing fracture of the 5th proximal phalanx. Electronically Signed   By: Carey Bullocks M.D.   On: 05/18/2023 14:22     PROCEDURES:  Critical Care performed: No  Procedures  MEDICATIONS ORDERED IN ED: Medications  ibuprofen (ADVIL) tablet 800 mg (800 mg Oral Given 05/18/23 1504)    IMPRESSION / MDM / ASSESSMENT AND PLAN / ED COURSE  I reviewed the triage vital signs and the nursing notes.                               57 y.o. female presents to the emergency department for evaluation and treatment of acute right foot injury clinically consistent with a closed nondisplaced subacute healing fracture of the fifth proximal phalanx. See HPI for further details.   Imaging ordered and reviewed and consistent with a subacute fracture.  Patient was administered ibuprofen for pain.  Patient is placed in a postop boot and buddy tape of the fourth and fifth right phalanx.  She is in stable condition for discharge  and outpatient follow-up.  Patient will be discharged home with prescriptions for ibuprofen.  Patient is to follow-up with orthopedics in 1 week. Patient is given ED precautions to return to the ED for any worsening or new symptoms. Patient verbalizes understanding. All questions and concerns were addressed during ED visit.    Patient's presentation is most consistent with acute complicated illness / injury requiring diagnostic workup.  FINAL CLINICAL IMPRESSION(S) / ED DIAGNOSES   Final diagnoses:  Nondisplaced fracture of proximal phalanx of right lesser toe(s), initial encounter for closed fracture     Rx / DC Orders   ED Discharge Orders          Ordered    ibuprofen (ADVIL) 800 MG tablet  Every 8 hours PRN        05/18/23 1459             Note:  This document was prepared using Dragon voice recognition software and may include unintentional dictation errors.    Romeo Apple, Markas Aldredge A, PA-C 05/18/23 1734    Chesley Noon, MD 05/19/23 [phone]

## 2023-08-16 ENCOUNTER — Other Ambulatory Visit: Payer: Self-pay

## 2023-08-16 ENCOUNTER — Emergency Department
Admission: EM | Admit: 2023-08-16 | Discharge: 2023-08-16 | Disposition: A | Discharge status: Home / Self Care | Payer: No Typology Code available for payment source | Attending: Emergency Medicine | Admitting: Emergency Medicine

## 2023-08-16 ENCOUNTER — Emergency Department: Payer: No Typology Code available for payment source

## 2023-08-16 DIAGNOSIS — A5901 Trichomonal vulvovaginitis: Secondary | ICD-10-CM | POA: Diagnosis not present

## 2023-08-16 DIAGNOSIS — N730 Acute parametritis and pelvic cellulitis: Secondary | ICD-10-CM | POA: Insufficient documentation

## 2023-08-16 DIAGNOSIS — N939 Abnormal uterine and vaginal bleeding, unspecified: Secondary | ICD-10-CM

## 2023-08-16 DIAGNOSIS — N898 Other specified noninflammatory disorders of vagina: Secondary | ICD-10-CM | POA: Diagnosis present

## 2023-08-16 LAB — URINALYSIS, ROUTINE W REFLEX MICROSCOPIC
Bacteria, UA: NONE SEEN
Bilirubin Urine: NEGATIVE
Glucose, UA: NEGATIVE mg/dL
Ketones, ur: NEGATIVE mg/dL
Nitrite: NEGATIVE
Protein, ur: NEGATIVE mg/dL
Specific Gravity, Urine: 1.012 (ref 1.005–1.030)
pH: 6 (ref 5.0–8.0)

## 2023-08-16 LAB — CHLAMYDIA/NGC RT PCR (ARMC ONLY)
Chlamydia Tr: NOT DETECTED
N gonorrhoeae: NOT DETECTED

## 2023-08-16 LAB — WET PREP, GENITAL
Clue Cells Wet Prep HPF POC: NONE SEEN
Sperm: NONE SEEN
WBC, Wet Prep HPF POC: >=10 — AB (ref <10)
Yeast Wet Prep HPF POC: NONE SEEN

## 2023-08-16 MED ORDER — METRONIDAZOLE 500 MG PO TABS
500.0000 mg | ORAL_TABLET | Freq: Once | ORAL | Status: AC
  Administered 2023-08-16: 500 mg via ORAL
  Filled 2023-08-16: qty 1

## 2023-08-16 MED ORDER — LIDOCAINE HCL 1 % IJ SOLN
INTRAMUSCULAR | Status: AC
  Filled 2023-08-16: qty 10

## 2023-08-16 MED ORDER — CEFTRIAXONE SODIUM 1 G IJ SOLR
500.0000 mg | Freq: Once | INTRAMUSCULAR | Status: AC
  Administered 2023-08-16: 500 mg via INTRAMUSCULAR
  Filled 2023-08-16: qty 10

## 2023-08-16 MED ORDER — ACETAMINOPHEN 500 MG PO TABS
1000.0000 mg | ORAL_TABLET | Freq: Once | ORAL | Status: AC
  Administered 2023-08-16: 1000 mg via ORAL
  Filled 2023-08-16: qty 2

## 2023-08-16 MED ORDER — ONDANSETRON 4 MG PO TBDP: 4.0000 mg | ORAL_TABLET | Freq: Three times a day (TID) | ORAL | 0 refills | Status: AC | PRN

## 2023-08-16 MED ORDER — METRONIDAZOLE 500 MG PO TABS: 500.0000 mg | ORAL_TABLET | Freq: Two times a day (BID) | ORAL | 0 refills | Status: AC

## 2023-08-16 MED ORDER — IBUPROFEN 600 MG PO TABS
600.0000 mg | ORAL_TABLET | Freq: Once | ORAL | Status: AC
  Administered 2023-08-16: 600 mg via ORAL
  Filled 2023-08-16: qty 1

## 2023-08-16 MED ORDER — OXYCODONE-ACETAMINOPHEN 5-325 MG PO TABS
1.0000 | ORAL_TABLET | Freq: Once | ORAL | Status: AC
  Administered 2023-08-16: 1 via ORAL
  Filled 2023-08-16: qty 1

## 2023-08-16 MED ORDER — METRONIDAZOLE 500 MG PO TABS: 500.0000 mg | ORAL_TABLET | Freq: Two times a day (BID) | ORAL | 0 refills | Status: DC

## 2023-08-16 MED ORDER — DOXYCYCLINE MONOHYDRATE 100 MG PO TABS: 100.0000 mg | ORAL_TABLET | Freq: Two times a day (BID) | ORAL | 0 refills | Status: AC

## 2023-08-16 NOTE — ED Provider Notes (Signed)
[facility] Provider Note    Event Date/Time   First MD Initiated Contact with Patient 08/16/23 1201     (approximate)   History   Vaginal Discharge   HPI  Shannon Chandler is a 57 y.o. female presents to the emergency department with vaginal discharge.  States that she has been having vaginal discharge over the past 6 weeks but over the past couple of days significantly has worsened.  Mild nausea.  No episodes of vomiting.  States that she feels very uncomfortable.  Has not been followed by gynecology.  No itching.  Endorses scant amount of bleeding with her discharge.    Physical Exam   Triage Vital Signs: ED Triage Vitals  Encounter Vitals Group     BP 08/16/23 1150 91/65     Systolic BP Percentile --      Diastolic BP Percentile --      Pulse Rate 08/16/23 1148 84     Resp 08/16/23 1148 18     Temp 08/16/23 1148 97.9 F (36.6 C)     Temp src --      SpO2 08/16/23 1148 95 %     Weight 08/16/23 1149 150 lb (68 kg)     Height 08/16/23 1149 5\' 6"  (1.676 m)     Head Circumference --      Peak Flow --      Pain Score 08/16/23 1149 9     Pain Loc --      Pain Education --      Exclude from Growth Chart --     Most recent vital signs: Vitals:   08/16/23 1148 08/16/23 1150  BP:  91/65  Pulse: 84   Resp: 18   Temp: 97.9 F (36.6 C)   SpO2: 95%     Physical Exam Exam conducted with a chaperone present.  Constitutional:      Appearance: She is well-developed.  HENT:     Head: Atraumatic.  Eyes:     Conjunctiva/sclera: Conjunctivae normal.  Cardiovascular:     Rate and Rhythm: Regular rhythm.  Pulmonary:     Effort: No respiratory distress.  Abdominal:     General: There is no distension.  Genitourinary:    Vagina: Vaginal discharge present.     Cervix: Friability present. No cervical motion tenderness.     Uterus: Normal.      Adnexa: Right adnexa normal and left adnexa normal.  Musculoskeletal:        General: Normal range of  motion.     Cervical back: Normal range of motion.  Skin:    General: Skin is warm.  Neurological:     Mental Status: She is alert. Mental status is at baseline.     IMPRESSION / MDM / ASSESSMENT AND PLAN / ED COURSE  I reviewed the triage vital signs and the nursing notes.  Differential diagnosis including trichomonas, yeast infection, STI, PID, TOA.  Clinical picture not consistent with ovarian torsion.  RADIOLOGY I independently reviewed imaging, my interpretation of imaging: Transvaginal ultrasound  - no obvious TOA.  LABS (all labs ordered are listed, but only abnormal results are displayed) Labs interpreted as -    Labs Reviewed  WET PREP, GENITAL - Abnormal; Notable for the following components:      Result Value   Trich, Wet Prep PRESENT (*)    WBC, Wet Prep HPF POC >=10 (*)    All other components within normal limits  URINALYSIS, ROUTINE W  REFLEX MICROSCOPIC - Abnormal; Notable for the following components:   Color, Urine YELLOW (*)    APPearance HAZY (*)    Hgb urine dipstick SMALL (*)    Leukocytes,Ua LARGE (*)    All other components within normal limits  CHLAMYDIA/NGC RT PCR ([facility] ONLY)            RPR  HIV ANTIBODY (ROUTINE TESTING W REFLEX)     MDM  UA concerning for possible urinary tract infection.  Wet prep positive for trichomonas.  Given first dose of Flagyl and IM Rocephin for possible PID.  Gonorrhea and Chlamydia negative  Transvaginal ultrasound without an obvious findings consistent with TOA.  Concern for possible PID/trichomonas.  Started on doxycycline and Flagyl.  Discussed close follow-up with primary care provider and need to follow-up with gynecology.  HIV and syphilis testing obtained.  Discussed that she will be notified if positive.  Discussed follow-up with the health department.     PROCEDURES:  Critical Care performed: No  Procedures  Patient's presentation is most consistent with acute presentation with potential threat to  life or bodily function.   MEDICATIONS ORDERED IN ED: Medications  cefTRIAXone (ROCEPHIN) injection 500 mg (has no administration in time range)  ibuprofen (ADVIL) tablet 600 mg (600 mg Oral Given 08/16/23 1325)  acetaminophen (TYLENOL) tablet 1,000 mg (1,000 mg Oral Given 08/16/23 1325)  metroNIDAZOLE (FLAGYL) tablet 500 mg (500 mg Oral Given 08/16/23 1356)  oxyCODONE-acetaminophen (PERCOCET/ROXICET) 5-325 MG per tablet 1 tablet (1 tablet Oral Given 08/16/23 1434)    FINAL CLINICAL IMPRESSION(S) / ED DIAGNOSES   Final diagnoses:  Trichomonas vaginalis (TV) infection  PID (acute pelvic inflammatory disease)     Rx / DC Orders   ED Discharge Orders          Ordered    metroNIDAZOLE (FLAGYL) 500 MG tablet  2 times daily,   Status:  Discontinued        08/16/23 1403    ondansetron (ZOFRAN-ODT) 4 MG disintegrating tablet  Every 8 hours PRN        08/16/23 1403    metroNIDAZOLE (FLAGYL) 500 MG tablet  2 times daily        08/16/23 1514    doxycycline (ADOXA) 100 MG tablet  2 times daily        08/16/23 1514             Note:  This document was prepared using Dragon voice recognition software and may include unintentional dictation errors.   Corena Herter, MD 08/16/23 1540

## 2023-08-16 NOTE — Discharge Instructions (Addendum)
You are seen in the emergency department for vaginal discharge.  You tested positive for trichomonas.  You had HIV and syphilis testing, you will be notified if these result positive.  It is importantly follow-up with the health department for further testing.  You may need repeat testing.  Follow-up with your primary care physician to get a referral for gynecology.  You were started on 2 different antibiotics and given a prescription for nausea medication.  Take these antibiotics as prescribed.  Return for fever worsening pain.  Doxycycline - This medication can cause acid reflux.  It is important that you take it with food and drink plenty of water.  Do not lie down for 1 hour after taking this medication.  It also causes sun sensitivity so stay out of the sun or wear SPF while on this medication.  zofran (ondansetron) - nausea medication, take 1 tablet every 8 hours as needed for nausea/vomiting.  Pain control:  Ibuprofen (motrin/aleve/advil) - You can take 3 tablets (600 mg) every 6 hours as needed for pain/fever.  Acetaminophen (tylenol) - You can take 2 extra strength tablets (1000 mg) every 6 hours as needed for pain/fever.  You can alternate these medications or take them together.  Make sure you eat food/drink water when taking these medications.  Thank you for choosing [place] for your health care, it was my pleasure to care for you today!  Corena Herter, MD

## 2023-08-16 NOTE — ED Notes (Signed)
Pt had to leave to pick up daughter. NAD NARD. Denied pain or discomfort. Will come back to ed for results

## 2023-08-16 NOTE — ED Triage Notes (Signed)
Pt to ED for vaginal discharge x6 weeks. Reports greenish in color and now having some pelvic discomfort.

## 2023-08-17 LAB — RPR: RPR Ser Ql: NONREACTIVE
